# Patient Record
Sex: Female | Born: 1980 | Race: Black or African American | Hispanic: No | Marital: Single | Smoking: Current some day smoker
Health system: Southern US, Community
[De-identification: ages and names within clinical notes are randomized; demographics above are authoritative.]

## PROBLEM LIST (undated history)

## (undated) DIAGNOSIS — D500 Iron deficiency anemia secondary to blood loss (chronic): Secondary | ICD-10-CM

## (undated) DIAGNOSIS — N920 Excessive and frequent menstruation with regular cycle: Secondary | ICD-10-CM

## (undated) HISTORY — PX: SKIN BIOPSY: SHX1

---

## 2013-01-16 ENCOUNTER — Emergency Department (HOSPITAL_COMMUNITY)
Admission: EM | Admit: 2013-01-16 | Discharge: 2013-01-16 | Disposition: A | Discharge status: Home / Self Care | Payer: Medicaid Other | Attending: Emergency Medicine | Admitting: Emergency Medicine

## 2013-01-16 ENCOUNTER — Encounter (HOSPITAL_COMMUNITY): Payer: Self-pay | Admitting: *Deleted

## 2013-01-16 DIAGNOSIS — Z7251 High risk heterosexual behavior: Secondary | ICD-10-CM | POA: Insufficient documentation

## 2013-01-16 DIAGNOSIS — Z87891 Personal history of nicotine dependence: Secondary | ICD-10-CM | POA: Insufficient documentation

## 2013-01-16 DIAGNOSIS — N72 Inflammatory disease of cervix uteri: Secondary | ICD-10-CM | POA: Insufficient documentation

## 2013-01-16 DIAGNOSIS — Z3202 Encounter for pregnancy test, result negative: Secondary | ICD-10-CM | POA: Insufficient documentation

## 2013-01-16 DIAGNOSIS — R350 Frequency of micturition: Secondary | ICD-10-CM | POA: Insufficient documentation

## 2013-01-16 DIAGNOSIS — M545 Low back pain, unspecified: Secondary | ICD-10-CM | POA: Insufficient documentation

## 2013-01-16 DIAGNOSIS — N898 Other specified noninflammatory disorders of vagina: Secondary | ICD-10-CM | POA: Insufficient documentation

## 2013-01-16 DIAGNOSIS — N949 Unspecified condition associated with female genital organs and menstrual cycle: Secondary | ICD-10-CM | POA: Insufficient documentation

## 2013-01-16 DIAGNOSIS — N390 Urinary tract infection, site not specified: Secondary | ICD-10-CM | POA: Insufficient documentation

## 2013-01-16 LAB — BASIC METABOLIC PANEL
Calcium: 9.3 mg/dL (ref 8.4–10.5)
Creatinine, Ser: 1.04 mg/dL (ref 0.50–1.10)
GFR calc Af Amer: 82 mL/min — ABNORMAL LOW (ref >90)
GFR calc non Af Amer: 71 mL/min — ABNORMAL LOW (ref >90)
Sodium: 140 mEq/L (ref 135–145)

## 2013-01-16 LAB — CBC
MCH: 19.8 pg — ABNORMAL LOW (ref 26.0–34.0)
MCHC: 29.9 g/dL — ABNORMAL LOW (ref 30.0–36.0)
MCV: 66.4 fL — ABNORMAL LOW (ref 78.0–100.0)
Platelets: 373 10*3/uL (ref 150–400)
RDW: 18.5 % — ABNORMAL HIGH (ref 11.5–15.5)

## 2013-01-16 LAB — URINALYSIS, ROUTINE W REFLEX MICROSCOPIC
Glucose, UA: NEGATIVE mg/dL
Ketones, ur: NEGATIVE mg/dL
Nitrite: NEGATIVE
Protein, ur: NEGATIVE mg/dL
Urobilinogen, UA: 1 mg/dL (ref 0.0–1.0)

## 2013-01-16 LAB — WET PREP, GENITAL

## 2013-01-16 LAB — URINE MICROSCOPIC-ADD ON

## 2013-01-16 MED ORDER — METRONIDAZOLE 500 MG PO TABS: 500.0000 mg | ORAL_TABLET | Freq: Two times a day (BID) | ORAL | Status: DC

## 2013-01-16 MED ORDER — IBUPROFEN 800 MG PO TABS
800.0000 mg | ORAL_TABLET | Freq: Once | ORAL | Status: AC
  Administered 2013-01-16: 800 mg via ORAL
  Filled 2013-01-16: qty 1

## 2013-01-16 MED ORDER — CIPROFLOXACIN HCL 500 MG PO TABS: 500.0000 mg | ORAL_TABLET | Freq: Two times a day (BID) | ORAL | Status: DC

## 2013-01-16 MED ORDER — CEFTRIAXONE SODIUM 250 MG IJ SOLR
250.0000 mg | Freq: Once | INTRAMUSCULAR | Status: AC
  Administered 2013-01-16: 250 mg via INTRAMUSCULAR
  Filled 2013-01-16: qty 250

## 2013-01-16 MED ORDER — AZITHROMYCIN 1 G PO PACK
1.0000 g | PACK | Freq: Once | ORAL | Status: AC
  Administered 2013-01-16: 1 g via ORAL
  Filled 2013-01-16: qty 1

## 2013-01-16 NOTE — ED Provider Notes (Signed)
History     CSN: [id number]  Arrival date & time 01/16/13  1034   First MD Initiated Contact with Patient 01/16/13 1122      No chief complaint on file.   (Consider location/radiation/quality/duration/timing/severity/associated sxs/prior treatment) HPI  Patient is a 32 year old female G3 P3 presenting for a week and a half of dysuria and vaginal discharge with mild vaginal discomfort.. Patient states the discharge is greenish yellow and copious. She has some associated low back pain. Patient states she's had recent unprotected sex and her last menstrual period was one month ago patient typically has normal cycles. Patient denies any prior history of any sexually transmitted diseases. Patient has increased frequency and urgency with her dysuria but denies any hematuria. Denies fever, chills, nausea, vomiting, diarrhea, abdominal pain.  History reviewed. No pertinent past medical history.  Past Surgical History  Procedure Laterality Date  . Cesarean section      No family history on file.  History  Substance Use Topics  . Smoking status: Former Games developer  . Smokeless tobacco: Not on file  . Alcohol Use: No    OB History   Grav Para Term Preterm Abortions TAB SAB Ect Mult Living                  Review of Systems  Constitutional: Negative.   HENT: Negative.   Eyes: Negative.   Respiratory: Negative.   Cardiovascular: Negative.   Gastrointestinal: Negative.   Genitourinary: Positive for dysuria, frequency and vaginal discharge. Negative for vaginal bleeding and pelvic pain.       Vaginal discomfort  Musculoskeletal:       Mild low back discomfort   Skin: Negative.   Neurological: Negative.   Psychiatric/Behavioral: Negative.     Allergies  Review of patient's allergies indicates no known allergies.  Home Medications  No current outpatient prescriptions on file.  BP 143/95  Pulse 87  Temp(Src) 98.4 F (36.9 C) (Oral)  Resp 20  SpO2 100%  LMP  12/15/2012  Physical Exam  Constitutional: She is oriented to person, place, and time. She appears well-developed and well-nourished.  HENT:  Head: Normocephalic and atraumatic.  Eyes: EOM are normal. Pupils are equal, round, and reactive to light.  Neck: Neck supple.  Cardiovascular: Normal rate, regular rhythm and normal heart sounds.   Pulmonary/Chest: Effort normal and breath sounds normal.  Abdominal: Soft. Bowel sounds are normal. There is no tenderness.  Genitourinary: Uterus normal. Cervix exhibits discharge. Cervix exhibits no motion tenderness and no friability. Right adnexum displays no mass, no tenderness and no fullness. Left adnexum displays no mass, no tenderness and no fullness. Vaginal discharge found.  Neurological: She is alert and oriented to person, place, and time.  Skin: Skin is warm and dry.  Psychiatric: She has a normal mood and affect.    ED Course  Procedures (including critical care time)  Labs Reviewed  CBC - Abnormal; Notable for the following:    Hemoglobin 9.2 (*)    HCT 30.8 (*)    MCV 66.4 (*)    MCH 19.8 (*)    MCHC 29.9 (*)    RDW 18.5 (*)    All other components within normal limits  BASIC METABOLIC PANEL - Abnormal; Notable for the following:    GFR calc non Af Amer 71 (*)    GFR calc Af Amer 82 (*)    All other components within normal limits  URINALYSIS, ROUTINE W REFLEX MICROSCOPIC - Abnormal; Notable for the following:  APPearance HAZY (*)    Hgb urine dipstick SMALL (*)    Leukocytes, UA LARGE (*)    All other components within normal limits  URINE MICROSCOPIC-ADD ON - Abnormal; Notable for the following:    Bacteria, UA FEW (*)    All other components within normal limits  URINE CULTURE  PREGNANCY, URINE   No results found.   1. Cervicitis   2. UTI (urinary tract infection)       MDM  Patient to be discharged with instructions to follow up with OBGYN. Pt understands GC/Chlamydia cultures pending and that they will  need to inform all sexual partners within the last 6 months if results return positive. Pt has been treated prophylacticly with azithromycin and rocephin due to pts history, pelvic exam, and wet prep with increased WBCs. Pt advised that she will receive a call in 48 hours if the test is positive and to refrain from sexual activity for 48 hours. If the test is positive, pt is advised to refrain from sexual activity for 10 days for the medicine to take effect.  Pt not concerning for PID because hemodynamically stable and no cervical motion tenderness on pelvic exam. Pt has also been treated with flagyl for Bacterial Vaginosis. Pt has been advised to not drink alcohol while on this medication. Pt has been diagnosed with a UTI. Pt is afebrile, no CVA tenderness, normotensive, and denies N/V. Pt to be dc home with antibiotics and instructions to follow up with PCP if symptoms persist. Discussed that because pt has had recent unprotected sex, might want to consider getting tested for HIV as well. Counseled pt that latex condoms are the only way to prevent against STDs or HIV. Patient d/w with Dr. Freida Busman, agrees with plan.               Jeannetta Ellis, PA-C 01/16/13 1556

## 2013-01-16 NOTE — ED Notes (Signed)
Pt is here with lower abdominal pain, lower abdominal pain, and vaginal discharge (yellow/green) with odor.  Duration 1.5 weeks.  LMP-12/15/12

## 2013-01-16 NOTE — ED Notes (Signed)
Pt discharged to home with family. NAD.  

## 2013-01-16 NOTE — ED Notes (Signed)
Pt was triaged, placed in peds with son to be triaged and explained she would have to be seen on adult side.  Peds collected urine and lab was sent to peds to collect blood

## 2013-01-17 ENCOUNTER — Telehealth (HOSPITAL_COMMUNITY): Payer: Self-pay | Admitting: Emergency Medicine

## 2013-01-17 LAB — URINE CULTURE: Colony Count: NO GROWTH

## 2013-01-17 NOTE — ED Notes (Signed)
Pt called for lab results.  ID Verified x 2.  Pt informed STD results negative.

## 2013-01-18 NOTE — ED Provider Notes (Signed)
Medical screening examination/treatment/procedure(s) were performed by non-physician practitioner and as supervising physician I was immediately available for consultation/collaboration.  Toy Baker, MD 01/18/13 [phone]

## 2013-01-30 ENCOUNTER — Telehealth (HOSPITAL_COMMUNITY): Payer: Self-pay | Admitting: Emergency Medicine

## 2013-01-30 NOTE — ED Notes (Signed)
Pt called for std results which were given to pt.

## 2013-01-30 NOTE — ED Notes (Signed)
Patient called to reconfirm dx of STD

## 2013-04-19 ENCOUNTER — Emergency Department (HOSPITAL_COMMUNITY)
Admission: EM | Admit: 2013-04-19 | Discharge: 2013-04-19 | Disposition: A | Discharge status: Home / Self Care | Payer: Medicaid Other | Attending: Emergency Medicine | Admitting: Emergency Medicine

## 2013-04-19 ENCOUNTER — Encounter (HOSPITAL_COMMUNITY): Payer: Self-pay | Admitting: Family Medicine

## 2013-04-19 DIAGNOSIS — Y929 Unspecified place or not applicable: Secondary | ICD-10-CM | POA: Insufficient documentation

## 2013-04-19 DIAGNOSIS — R52 Pain, unspecified: Secondary | ICD-10-CM

## 2013-04-19 DIAGNOSIS — Z87891 Personal history of nicotine dependence: Secondary | ICD-10-CM | POA: Insufficient documentation

## 2013-04-19 DIAGNOSIS — S8990XA Unspecified injury of unspecified lower leg, initial encounter: Secondary | ICD-10-CM | POA: Insufficient documentation

## 2013-04-19 DIAGNOSIS — W010XXA Fall on same level from slipping, tripping and stumbling without subsequent striking against object, initial encounter: Secondary | ICD-10-CM | POA: Insufficient documentation

## 2013-04-19 DIAGNOSIS — H6092 Unspecified otitis externa, left ear: Secondary | ICD-10-CM

## 2013-04-19 DIAGNOSIS — Y9311 Activity, swimming: Secondary | ICD-10-CM | POA: Insufficient documentation

## 2013-04-19 DIAGNOSIS — W19XXXA Unspecified fall, initial encounter: Secondary | ICD-10-CM

## 2013-04-19 DIAGNOSIS — H60399 Other infective otitis externa, unspecified ear: Secondary | ICD-10-CM | POA: Insufficient documentation

## 2013-04-19 MED ORDER — CIPROFLOXACIN-DEXAMETHASONE 0.3-0.1 % OT SUSP: 4.0000 [drp] | Freq: Two times a day (BID) | OTIC | Status: DC

## 2013-04-19 MED ORDER — TRAMADOL HCL 50 MG PO TABS: 50.0000 mg | ORAL_TABLET | Freq: Four times a day (QID) | ORAL | Status: DC | PRN

## 2013-04-19 NOTE — ED Provider Notes (Signed)
History    CSN: [id number] Arrival date & time 04/19/13  1327  First MD Initiated Contact with Patient 04/19/13 1419     Chief Complaint  Patient presents with  . Fall   (Consider location/radiation/quality/duration/timing/severity/associated sxs/prior Treatment) The history is provided by the patient and medical records.   Pt presents to the ED complaining of diffuse right sided pain [place].  Pt states she did not see the water in the floor, stepped in it and fell onto the floor with initial impact onto her right knee and fell onto her right side.  No head trauma or LOC.  Now has diffuse pain of right shoulder, side, hip, and knee.  No difficulty walking, pt ambulatory multiple times in the ED.  No numbness or paresthesias of LE.  Pt also complains of left ear pain x 2 days.  Went swimming a few weeks ago which she usually does not do.  Pain worse with lying on her left ear and moving the ear.  No changes in hearing or tinnitus.  No fevers, sweats, or chills.  History reviewed. No pertinent past medical history. Past Surgical History  Procedure Laterality Date  . Cesarean section     History reviewed. No pertinent family history. History  Substance Use Topics  . Smoking status: Former Games developer  . Smokeless tobacco: Not on file  . Alcohol Use: No   OB History   Grav Para Term Preterm Abortions TAB SAB Ect Mult Living                 Review of Systems  HENT: Positive for ear pain.   Musculoskeletal: Positive for arthralgias.  All other systems reviewed and are negative.    Allergies  Review of patient's allergies indicates no known allergies.  Home Medications   Current Outpatient Rx  Name  Route  Sig  Dispense  Refill  . ibuprofen (ADVIL,MOTRIN) 200 MG tablet   Oral   Take 400 mg by mouth every 6 (six) hours as needed for pain.          BP 116/66  Pulse 74  Temp(Src) 98.3 F (36.8 C)  Resp 18  SpO2 100%  LMP  03/27/2013  Physical Exam  Nursing note and vitals reviewed. Constitutional: She is oriented to person, place, and time. She appears well-developed and well-nourished.  HENT:  Head: Normocephalic and atraumatic.  Right Ear: Tympanic membrane and ear canal normal.  Left Ear: Tympanic membrane normal. There is swelling and tenderness.  Mouth/Throat: Oropharynx is clear and moist.  Left EAC swollen and erythematous, left TM normal in appearance, pain with palpation to tragus and movement of external ear  Eyes: Conjunctivae and EOM are normal. Pupils are equal, round, and reactive to light.  Neck: Normal range of motion.  Cardiovascular: Normal rate, regular rhythm and normal heart sounds.   Pulmonary/Chest: Effort normal and breath sounds normal. She exhibits no tenderness, no bony tenderness, no laceration, no edema and no retraction.  No abrasion, laceration, bruising, deformity, or crepitus  Abdominal: Soft. Bowel sounds are normal.  Musculoskeletal: Normal range of motion.       Right shoulder: She exhibits pain. She exhibits normal range of motion, no tenderness, no bony tenderness, no swelling, no effusion, no crepitus, no deformity, no laceration, no spasm, normal pulse and normal strength.       Right knee: She exhibits normal range of motion, no swelling, no effusion, no ecchymosis, no deformity, no  laceration and no erythema. No tenderness found.  Full ROM of right knee and shoulder with some crepitus noted, no pinpoint TTP, strong distal pulses, sensation intact  Neurological: She is alert and oriented to person, place, and time.  Skin: Skin is warm and dry.  Psychiatric: She has a normal mood and affect.    ED Course  Procedures (including critical care time) Labs Reviewed - No data to display No results found.  1. Fall, initial encounter   2. Diffuse pain     MDM   Left OE without OM.  Diffuse pain of right side without pinpoint tenderness.  Pt moving all extremities,  ambulatory multiple times in the ED without difficulty-- imaging deferred.  Pt written for tramadol and ciprodex as discussed with pt but she left without Rxs or d/c instructions.  Garlon Hatchet, PA-C 04/19/13 1559

## 2013-04-19 NOTE — ED Notes (Signed)
PT DID NOT RETURN TO RECEIVE D/C INSTRUCTIONS OR RX.

## 2013-04-19 NOTE — ED Notes (Addendum)
Per pt she slipped and fell in some water yesterday and now having left side pain. sts left side of neck, arm, knee and leg

## 2013-04-19 NOTE — ED Notes (Signed)
Left before receiving discharge instructions or Rx.

## 2013-04-19 NOTE — ED Notes (Signed)
C/O right neck, arm, knee and leg pain since fall yesterday. Also c/o left ear ache x 2 days.

## 2013-04-19 NOTE — ED Notes (Signed)
Pt not found in room or in lobby to give discharge instructions and Rx.

## 2013-04-22 NOTE — ED Provider Notes (Signed)
Medical screening examination/treatment/procedure(s) were performed by non-physician practitioner and as supervising physician I was immediately available for consultation/collaboration.  Derwood Kaplan, MD 04/22/13 [phone]

## 2014-05-09 ENCOUNTER — Emergency Department (HOSPITAL_COMMUNITY)
Admission: EM | Admit: 2014-05-09 | Discharge: 2014-05-09 | Discharge status: Against Medical Advice | Payer: Medicaid Other | Attending: Emergency Medicine | Admitting: Emergency Medicine

## 2014-05-09 ENCOUNTER — Encounter (HOSPITAL_COMMUNITY): Payer: Self-pay | Admitting: Emergency Medicine

## 2014-05-09 DIAGNOSIS — M545 Low back pain, unspecified: Secondary | ICD-10-CM | POA: Insufficient documentation

## 2014-05-09 DIAGNOSIS — R42 Dizziness and giddiness: Secondary | ICD-10-CM | POA: Insufficient documentation

## 2014-05-09 DIAGNOSIS — R110 Nausea: Secondary | ICD-10-CM | POA: Diagnosis not present

## 2014-05-09 DIAGNOSIS — F17200 Nicotine dependence, unspecified, uncomplicated: Secondary | ICD-10-CM | POA: Diagnosis not present

## 2014-05-09 LAB — URINALYSIS, ROUTINE W REFLEX MICROSCOPIC
BILIRUBIN URINE: NEGATIVE
Glucose, UA: NEGATIVE mg/dL
KETONES UR: NEGATIVE mg/dL
Leukocytes, UA: NEGATIVE
NITRITE: NEGATIVE
PH: 5 (ref 5.0–8.0)
Protein, ur: NEGATIVE mg/dL
SPECIFIC GRAVITY, URINE: 1.018 (ref 1.005–1.030)
Urobilinogen, UA: 0.2 mg/dL (ref 0.0–1.0)

## 2014-05-09 LAB — URINE MICROSCOPIC-ADD ON

## 2014-05-09 LAB — POC URINE PREG, ED: Preg Test, Ur: NEGATIVE

## 2014-05-09 NOTE — ED Notes (Signed)
Pt reports lower back pain that radiates down R leg, also reports nausea and dizziness

## 2014-05-12 ENCOUNTER — Encounter (HOSPITAL_COMMUNITY): Payer: Self-pay | Admitting: Emergency Medicine

## 2014-05-12 ENCOUNTER — Emergency Department (HOSPITAL_COMMUNITY)
Admission: EM | Admit: 2014-05-12 | Discharge: 2014-05-13 | Disposition: A | Discharge status: Home / Self Care | Payer: Medicaid Other | Attending: Emergency Medicine | Admitting: Emergency Medicine

## 2014-05-12 DIAGNOSIS — D500 Iron deficiency anemia secondary to blood loss (chronic): Secondary | ICD-10-CM | POA: Diagnosis not present

## 2014-05-12 DIAGNOSIS — M545 Low back pain, unspecified: Secondary | ICD-10-CM | POA: Insufficient documentation

## 2014-05-12 DIAGNOSIS — Z3202 Encounter for pregnancy test, result negative: Secondary | ICD-10-CM | POA: Diagnosis not present

## 2014-05-12 DIAGNOSIS — F17200 Nicotine dependence, unspecified, uncomplicated: Secondary | ICD-10-CM | POA: Insufficient documentation

## 2014-05-12 DIAGNOSIS — M7989 Other specified soft tissue disorders: Secondary | ICD-10-CM | POA: Diagnosis present

## 2014-05-12 DIAGNOSIS — R109 Unspecified abdominal pain: Secondary | ICD-10-CM | POA: Insufficient documentation

## 2014-05-12 LAB — CBC WITH DIFFERENTIAL/PLATELET
BASOS ABS: 0 10*3/uL (ref 0.0–0.1)
Basophils Relative: 0 % (ref 0–1)
EOS ABS: 0.3 10*3/uL (ref 0.0–0.7)
Eosinophils Relative: 3 % (ref 0–5)
HEMATOCRIT: 26.1 % — AB (ref 36.0–46.0)
Hemoglobin: 7.6 g/dL — ABNORMAL LOW (ref 12.0–15.0)
LYMPHS PCT: 23 % (ref 12–46)
Lymphs Abs: 2.1 10*3/uL (ref 0.7–4.0)
MCH: 20.2 pg — ABNORMAL LOW (ref 26.0–34.0)
MCHC: 29.1 g/dL — AB (ref 30.0–36.0)
MCV: 69.2 fL — ABNORMAL LOW (ref 78.0–100.0)
MONOS PCT: 3 % (ref 3–12)
Monocytes Absolute: 0.3 10*3/uL (ref 0.1–1.0)
NEUTROS PCT: 71 % (ref 43–77)
Neutro Abs: 6.3 10*3/uL (ref 1.7–7.7)
PLATELETS: 325 10*3/uL (ref 150–400)
RBC: 3.77 MIL/uL — ABNORMAL LOW (ref 3.87–5.11)
RDW: 18.3 % — AB (ref 11.5–15.5)
WBC: 9 10*3/uL (ref 4.0–10.5)

## 2014-05-12 LAB — COMPREHENSIVE METABOLIC PANEL
ALBUMIN: 3.1 g/dL — AB (ref 3.5–5.2)
ALK PHOS: 84 U/L (ref 39–117)
ALT: 13 U/L (ref 0–35)
AST: 21 U/L (ref 0–37)
Anion gap: 11 (ref 5–15)
BUN: 15 mg/dL (ref 6–23)
CALCIUM: 8.9 mg/dL (ref 8.4–10.5)
CO2: 24 mEq/L (ref 19–32)
Chloride: 108 mEq/L (ref 96–112)
Creatinine, Ser: 1.03 mg/dL (ref 0.50–1.10)
GFR calc non Af Amer: 71 mL/min — ABNORMAL LOW (ref >90)
GFR, EST AFRICAN AMERICAN: 82 mL/min — AB (ref >90)
GLUCOSE: 96 mg/dL (ref 70–99)
POTASSIUM: 3.8 meq/L (ref 3.7–5.3)
SODIUM: 143 meq/L (ref 137–147)
TOTAL PROTEIN: 6.6 g/dL (ref 6.0–8.3)
Total Bilirubin: <0.2 mg/dL — ABNORMAL LOW (ref 0.3–1.2)

## 2014-05-12 LAB — URINALYSIS, ROUTINE W REFLEX MICROSCOPIC
Bilirubin Urine: NEGATIVE
GLUCOSE, UA: NEGATIVE mg/dL
Ketones, ur: 15 mg/dL — AB
Leukocytes, UA: NEGATIVE
NITRITE: NEGATIVE
PROTEIN: NEGATIVE mg/dL
SPECIFIC GRAVITY, URINE: 1.028 (ref 1.005–1.030)
Urobilinogen, UA: 0.2 mg/dL (ref 0.0–1.0)
pH: 5 (ref 5.0–8.0)

## 2014-05-12 LAB — URINE MICROSCOPIC-ADD ON

## 2014-05-12 LAB — POC URINE PREG, ED: Preg Test, Ur: NEGATIVE

## 2014-05-12 MED ORDER — FERROUS SULFATE 325 (65 FE) MG PO TABS: 325.0000 mg | ORAL_TABLET | Freq: Every day | ORAL | Status: DC

## 2014-05-12 MED ORDER — TRAMADOL HCL 50 MG PO TABS: 50.0000 mg | ORAL_TABLET | Freq: Four times a day (QID) | ORAL | Status: DC | PRN

## 2014-05-12 MED ORDER — OXYCODONE-ACETAMINOPHEN 5-325 MG PO TABS
1.0000 | ORAL_TABLET | Freq: Once | ORAL | Status: AC
  Administered 2014-05-12: 1 via ORAL
  Filled 2014-05-12: qty 1

## 2014-05-12 NOTE — ED Provider Notes (Signed)
CSN: [id number]     Arrival date & time 05/12/14  2032 History   First MD Initiated Contact with Patient 05/12/14 2259     Chief Complaint  Patient presents with  . Foot Swelling  . Flank Pain     (Consider location/radiation/quality/duration/timing/severity/associated sxs/prior Treatment) Patient is a 34 y.o. female presenting with flank pain.  Flank Pain    This patient is a 33 yo obese woman who states she is otherwise healthy. She comes in from home with complaints of aching pain in her right low back and a "knot" in that area. Pain is worse with certain movements of the torso. Pain is nonradiating and moderate in severity. Pt denies history of trauma or strain. No GU sx. No fever.   Patient also complains that both of her feet are swollen.  She says she has no h/o same. Says she has felt fatigued. Has been sleeping more than normal but does not wake up rested. Followed by a PCP with annual well check exams.   History reviewed. No pertinent past medical history. Past Surgical History  Procedure Laterality Date  . Cesarean section     History reviewed. No pertinent family history. History  Substance Use Topics  . Smoking status: Current Some Day Smoker  . Smokeless tobacco: Never Used  . Alcohol Use: No   OB History   Grav Para Term Preterm Abortions TAB SAB Ect Mult Living                 Review of Systems  Genitourinary: Positive for flank pain.    10 point review of symptoms obtained and is negative with the exceptions of symptoms noted abov.e   Allergies  Review of patient's allergies indicates no known allergies.  Home Medications   Prior to Admission medications   Medication Sig Start Date End Date Taking? Authorizing Provider  ibuprofen (ADVIL,MOTRIN) 200 MG tablet Take 400 mg by mouth every 6 (six) hours as needed for pain.   Yes Historical Provider, MD   BP 125/77  Pulse 90  Temp(Src) 97.6 F (36.4 C) (Oral)  Resp 20  Ht 5\' 6"  (1.676 m)  Wt 225 lb  (102.059 kg)  BMI 36.33 kg/m2  SpO2 100%  LMP 03/31/2014 Physical Exam  Gen: well nourished and well developed appearing, sleeping but arousable. Head: NCAT Ears: normal to inspection Nose: normal to inspection, no epistaxis or drainage Mouth: oral mucsoa is well hydrated appearing, normal posterior oropharynx Neck: supple, no stridor CV: RRR, no murmur, palpable peripheral pulses Resp: lung sounds are clear to auscultation bilaterally, no wheeing or rhonchi or rales, normal respiratory effort. Abd: morbidly obese,soft, nontender, nondistended Back: normal to inspection, no midline or CVA ttp. ttp over the right paraspinal musculature in lumbar region.  Extremities: normal to inspection.  Skin: warm and dry, I can't say I really appreciate any edema of the lower extremities.  Neuro: CN ii - XII, no focal deficitis Psyche; normal affect, cooperative.   ED Course  Procedures (including critical care time) Labs Review  Results for orders placed during the hospital encounter of 05/12/14 (from the past 24 hour(s))  URINALYSIS, ROUTINE W REFLEX MICROSCOPIC     Status: Abnormal   Collection Time    05/12/14  9:50 PM      Result Value Ref Range   Color, Urine YELLOW  YELLOW   APPearance CLOUDY (*) CLEAR   Specific Gravity, Urine 1.028  1.005 - 1.030   pH 5.0  5.0 - 8.0  Glucose, UA NEGATIVE  NEGATIVE mg/dL   Hgb urine dipstick SMALL (*) NEGATIVE   Bilirubin Urine NEGATIVE  NEGATIVE   Ketones, ur 15 (*) NEGATIVE mg/dL   Protein, ur NEGATIVE  NEGATIVE mg/dL   Urobilinogen, UA 0.2  0.0 - 1.0 mg/dL   Nitrite NEGATIVE  NEGATIVE   Leukocytes, UA NEGATIVE  NEGATIVE  URINE MICROSCOPIC-ADD ON     Status: Abnormal   Collection Time    05/12/14  9:50 PM      Result Value Ref Range   Squamous Epithelial / LPF FEW (*) RARE   WBC, UA 3-6  <3 WBC/hpf   RBC / HPF 3-6  <3 RBC/hpf   Bacteria, UA FEW (*) RARE   Casts HYALINE CASTS (*) NEGATIVE  CBC WITH DIFFERENTIAL     Status: Abnormal    Collection Time    05/12/14 10:00 PM      Result Value Ref Range   WBC 9.0  4.0 - 10.5 K/uL   RBC 3.77 (*) 3.87 - 5.11 MIL/uL   Hemoglobin 7.6 (*) 12.0 - 15.0 g/dL   HCT 16.126.1 (*) 09.636.0 - 04.546.0 %   MCV 69.2 (*) 78.0 - 100.0 fL   MCH 20.2 (*) 26.0 - 34.0 pg   MCHC 29.1 (*) 30.0 - 36.0 g/dL   RDW 40.918.3 (*) 81.111.5 - 91.415.5 %   Platelets 325  150 - 400 K/uL   Neutrophils Relative % 71  43 - 77 %   Lymphocytes Relative 23  12 - 46 %   Monocytes Relative 3  3 - 12 %   Eosinophils Relative 3  0 - 5 %   Basophils Relative 0  0 - 1 %   Neutro Abs 6.3  1.7 - 7.7 K/uL   Lymphs Abs 2.1  0.7 - 4.0 K/uL   Monocytes Absolute 0.3  0.1 - 1.0 K/uL   Eosinophils Absolute 0.3  0.0 - 0.7 K/uL   Basophils Absolute 0.0  0.0 - 0.1 K/uL   RBC Morphology POLYCHROMASIA PRESENT     Smear Review LARGE PLATELETS PRESENT    COMPREHENSIVE METABOLIC PANEL     Status: Abnormal   Collection Time    05/12/14 10:00 PM      Result Value Ref Range   Sodium 143  137 - 147 mEq/L   Potassium 3.8  3.7 - 5.3 mEq/L   Chloride 108  96 - 112 mEq/L   CO2 24  19 - 32 mEq/L   Glucose, Bld 96  70 - 99 mg/dL   BUN 15  6 - 23 mg/dL   Creatinine, Ser 7.821.03  0.50 - 1.10 mg/dL   Calcium 8.9  8.4 - 95.610.5 mg/dL   Total Protein 6.6  6.0 - 8.3 g/dL   Albumin 3.1 (*) 3.5 - 5.2 g/dL   AST 21  0 - 37 U/L   ALT 13  0 - 35 U/L   Alkaline Phosphatase 84  39 - 117 U/L   Total Bilirubin <0.2 (*) 0.3 - 1.2 mg/dL   GFR calc non Af Amer 71 (*) >90 mL/min   GFR calc Af Amer 82 (*) >90 mL/min   Anion gap 11  5 - 15  POC URINE PREG, ED     Status: None   Collection Time    05/12/14 10:07 PM      Result Value Ref Range   Preg Test, Ur NEGATIVE  NEGATIVE     MDM     ED work up is  notable for microcytic anemia - worsened from 2014. Patient endorses heavy periods. We will start on iron supplement. Patient counseled re: importance of close outpatient f/u with her PCP and need for thyroid screening.   Her back pain appears to be MSK and we will  manage symptomatically. The patient needs to lose weight.   Brandt Loosen, MD 05/12/14 [phone]

## 2014-05-12 NOTE — ED Notes (Signed)
Patient presents with c/o right side pain that startes in her lower back and down her legs.  Feet swelling bilaterally

## 2014-05-12 NOTE — Discharge Instructions (Signed)
Anemia, Nonspecific  Anemia is a condition in which the concentration of red blood cells or hemoglobin in the blood is below normal. Hemoglobin is a substance in red blood cells that carries oxygen to the tissues of the body. Anemia results in not enough oxygen reaching these tissues.   CAUSES   Common causes of anemia include:   · Excessive bleeding. Bleeding may be internal or external. This includes excessive bleeding from periods (in women) or from the intestine.    · Poor nutrition.    · Chronic kidney, thyroid, and liver disease.   · Bone marrow disorders that decrease red blood cell production.  · Cancer and treatments for cancer.  · HIV, AIDS, and their treatments.  · Spleen problems that increase red blood cell destruction.  · Blood disorders.  · Excess destruction of red blood cells due to infection, medicines, and autoimmune disorders.  SIGNS AND SYMPTOMS   · Minor weakness.    · Dizziness.    · Headache.  · Palpitations.    · Shortness of breath, especially with exercise.    · Paleness.  · Cold sensitivity.  · Indigestion.  · Nausea.  · Difficulty sleeping.  · Difficulty concentrating.  Symptoms may occur suddenly or they may develop slowly.   DIAGNOSIS   Additional blood tests are often needed. These help your health care provider determine the best treatment. Your health care provider will check your stool for blood and look for other causes of blood loss.   TREATMENT   Treatment varies depending on the cause of the anemia. Treatment can include:   · Supplements of iron, vitamin B12, or folic acid.    · Hormone medicines.    · A blood transfusion. This may be needed if blood loss is severe.    · Hospitalization. This may be needed if there is significant continual blood loss.    · Dietary changes.  · Spleen removal.  HOME CARE INSTRUCTIONS  Keep all follow-up appointments. It often takes many weeks to correct anemia, and having your health care provider check on your condition and your response to  treatment is very important.  SEEK IMMEDIATE MEDICAL CARE IF:   · You develop extreme weakness, shortness of breath, or chest pain.    · You become dizzy or have trouble concentrating.  · You develop heavy vaginal bleeding.    · You develop a rash.    · You have bloody or black, tarry stools.    · You faint.    · You vomit up blood.    · You vomit repeatedly.    · You have abdominal pain.  · You have a fever or persistent symptoms for more than 2-3 days.    · You have a fever and your symptoms suddenly get worse.    · You are dehydrated.    MAKE SURE YOU:  · Understand these instructions.  · Will watch your condition.  · Will get help right away if you are not doing well or get worse.  Document Released: 10/21/2004 Document Revised: 05/16/2013 Document Reviewed: 03/09/2013  Exit[employer]® Patient Information ©2015 [employer]. This information is not intended to replace advice given to you by your health care provider. Make sure you discuss any questions you have with your health care provider.  Anemia, Nonspecific  Anemia is a condition in which the concentration of red blood cells or hemoglobin in the blood is below normal. Hemoglobin is a substance in red blood cells that carries oxygen to the tissues of the body. Anemia results in not enough oxygen reaching these tissues.   CAUSES     Common causes of anemia include:   · Excessive bleeding. Bleeding may be internal or external. This includes excessive bleeding from periods (in women) or from the intestine.    · Poor nutrition.    · Chronic kidney, thyroid, and liver disease.   · Bone marrow disorders that decrease red blood cell production.  · Cancer and treatments for cancer.  · HIV, AIDS, and their treatments.  · Spleen problems that increase red blood cell destruction.  · Blood disorders.  · Excess destruction of red blood cells due to infection, medicines, and autoimmune disorders.  SIGNS AND SYMPTOMS   · Minor weakness.    · Dizziness.     · Headache.  · Palpitations.    · Shortness of breath, especially with exercise.    · Paleness.  · Cold sensitivity.  · Indigestion.  · Nausea.  · Difficulty sleeping.  · Difficulty concentrating.  Symptoms may occur suddenly or they may develop slowly.   DIAGNOSIS   Additional blood tests are often needed. These help your health care provider determine the best treatment. Your health care provider will check your stool for blood and look for other causes of blood loss.   TREATMENT   Treatment varies depending on the cause of the anemia. Treatment can include:   · Supplements of iron, vitamin B12, or folic acid.    · Hormone medicines.    · A blood transfusion. This may be needed if blood loss is severe.    · Hospitalization. This may be needed if there is significant continual blood loss.    · Dietary changes.  · Spleen removal.  HOME CARE INSTRUCTIONS  Keep all follow-up appointments. It often takes many weeks to correct anemia, and having your health care provider check on your condition and your response to treatment is very important.  SEEK IMMEDIATE MEDICAL CARE IF:   · You develop extreme weakness, shortness of breath, or chest pain.    · You become dizzy or have trouble concentrating.  · You develop heavy vaginal bleeding.    · You develop a rash.    · You have bloody or black, tarry stools.    · You faint.    · You vomit up blood.    · You vomit repeatedly.    · You have abdominal pain.  · You have a fever or persistent symptoms for more than 2-3 days.    · You have a fever and your symptoms suddenly get worse.    · You are dehydrated.    MAKE SURE YOU:  · Understand these instructions.  · Will watch your condition.  · Will get help right away if you are not doing well or get worse.  Document Released: 10/21/2004 Document Revised: 05/16/2013 Document Reviewed: 03/09/2013  ExitCare® Patient Information ©2015 [employer]. This information is not intended to replace advice given to you by your health care  provider. Make sure you discuss any questions you have with your health care provider.

## 2014-05-20 ENCOUNTER — Emergency Department (HOSPITAL_COMMUNITY)
Admission: EM | Admit: 2014-05-20 | Discharge: 2014-05-20 | Disposition: A | Discharge status: Home / Self Care | Payer: Medicaid Other | Attending: Emergency Medicine | Admitting: Emergency Medicine

## 2014-05-20 ENCOUNTER — Encounter (HOSPITAL_COMMUNITY): Payer: Self-pay | Admitting: Emergency Medicine

## 2014-05-20 DIAGNOSIS — M549 Dorsalgia, unspecified: Secondary | ICD-10-CM | POA: Diagnosis present

## 2014-05-20 DIAGNOSIS — F17200 Nicotine dependence, unspecified, uncomplicated: Secondary | ICD-10-CM | POA: Diagnosis not present

## 2014-05-20 DIAGNOSIS — R109 Unspecified abdominal pain: Secondary | ICD-10-CM | POA: Insufficient documentation

## 2014-05-20 DIAGNOSIS — IMO0002 Reserved for concepts with insufficient information to code with codable children: Secondary | ICD-10-CM | POA: Insufficient documentation

## 2014-05-20 DIAGNOSIS — M542 Cervicalgia: Secondary | ICD-10-CM | POA: Insufficient documentation

## 2014-05-20 DIAGNOSIS — Z79899 Other long term (current) drug therapy: Secondary | ICD-10-CM | POA: Insufficient documentation

## 2014-05-20 DIAGNOSIS — M5416 Radiculopathy, lumbar region: Secondary | ICD-10-CM

## 2014-05-20 MED ORDER — OXYCODONE-ACETAMINOPHEN 5-325 MG PO TABS: 2.0000 | ORAL_TABLET | Freq: Four times a day (QID) | ORAL | Status: DC | PRN

## 2014-05-20 MED ORDER — OXYCODONE-ACETAMINOPHEN 5-325 MG PO TABS
2.0000 | ORAL_TABLET | Freq: Once | ORAL | Status: AC
Start: 2014-05-20 — End: 2014-05-20
  Administered 2014-05-20: 2 via ORAL
  Filled 2014-05-20: qty 2

## 2014-05-20 MED ORDER — PREDNISONE 20 MG PO TABS: 40.0000 mg | ORAL_TABLET | Freq: Every day | ORAL | Status: DC

## 2014-05-20 NOTE — ED Notes (Signed)
Pt c/o back pain that radiates down her legs and upper her spine that has been going on for about 2 weeks now. States that when she was seen before for the pain no one told her what was causing the pain. Pt states that pain is worse after waking up in the mornings.  Pt also states that she has fevers every couple of days.

## 2014-05-20 NOTE — ED Provider Notes (Signed)
CSN: [id number]     Arrival date & time 05/20/14  1558 History  This chart was scribed for a non-physician practitioner, Roxy Horseman, PA-C, working with Mirian Mo, MD by Julian Hy, ED Scribe. The patient was seen in [facility]. The patient's care was started at 4:30 PM.    Chief Complaint  Patient presents with  . Back Pain   The history is provided by the patient. No language interpreter was used.   HPI Comments: Peggy Reid is a 33 y.o. female who presents to the Emergency Department complaining of new, constant, moderate lower back pain onset 3 weeks ago. Pt reports her pain shoots down her legs, abdomen, buttocks and her neck. Pt is able to ambulate. Pt reports she is stiff. Pt reports she previously went to [facility] ED and back pain my have been related to muscle issue. Pt states her pain is worsened with movement. Pt denies injury. Pt denies previous surgery. Pt denies hx of IV drug usage.  Denies bowel or bladder incontinence.  History reviewed. No pertinent past medical history. Past Surgical History  Procedure Laterality Date  . Cesarean section     No family history on file. History  Substance Use Topics  . Smoking status: Current Some Day Smoker  . Smokeless tobacco: Never Used  . Alcohol Use: No   OB History   Grav Para Term Preterm Abortions TAB SAB Ect Mult Living                 Review of Systems  Constitutional: Negative for fever.  Respiratory: Negative for shortness of breath.   Cardiovascular: Negative for chest pain and leg swelling.  Gastrointestinal: Positive for abdominal pain. Negative for constipation and abdominal distention.  Genitourinary: Negative for dysuria, urgency, frequency, flank pain and difficulty urinating.  Musculoskeletal: Positive for arthralgias (buttocks and legs bilaterally), back pain and neck pain. Negative for gait problem and joint swelling.  Skin: Negative for rash.  Neurological: Negative for weakness and numbness.    Allergies  Review of patient's allergies indicates no known allergies.  Home Medications   Prior to Admission medications   Medication Sig Start Date End Date Taking? Authorizing Provider  ibuprofen (ADVIL,MOTRIN) 200 MG tablet Take 400 mg by mouth every 6 (six) hours as needed for pain.   Yes Historical Provider, MD  ferrous sulfate 325 (65 FE) MG tablet Take 1 tablet (325 mg total) by mouth daily. 05/12/14   Brandt Loosen, MD  traMADol (ULTRAM) 50 MG tablet Take 1 tablet (50 mg total) by mouth every 6 (six) hours as needed. 05/12/14   Brandt Loosen, MD   Triage Vitals: BP 124/87  Pulse 70  Temp(Src) 98.4 F (36.9 C) (Oral)  Resp 20  Wt 225 lb (102.059 kg)  SpO2 100%  LMP 03/31/2014 Physical Exam  Nursing note and vitals reviewed. Constitutional: She is oriented to person, place, and time. She appears well-developed and well-nourished. No distress.  HENT:  Head: Normocephalic and atraumatic.  Eyes: Conjunctivae and EOM are normal. Right eye exhibits no discharge. Left eye exhibits no discharge. No scleral icterus.  Neck: Normal range of motion. Neck supple. No tracheal deviation present.  Cardiovascular: Normal rate, regular rhythm and normal heart sounds.  Exam reveals no gallop and no friction rub.   No murmur heard. Pulmonary/Chest: Effort normal and breath sounds normal. No respiratory distress. She has no wheezes.  Abdominal: Soft. She exhibits no distension. There is no tenderness.  Musculoskeletal: Normal range of motion.  Lumbar  paraspinal muscles tender to palpation, no bony tenderness, step-offs, or gross abnormality or deformity of spine, patient is able to ambulate, moves all extremities  Bilateral great toe extension intact Bilateral plantar/dorsiflexion intact  Neurological: She is alert and oriented to person, place, and time. She has normal reflexes.  Sensation and strength intact bilaterally Symmetrical reflexes  Skin: Skin is warm and dry. She is not  diaphoretic.  Psychiatric: She has a normal mood and affect. Her behavior is normal. Judgment and thought content normal.    ED Course  Procedures (including critical care time) DIAGNOSTIC STUDIES: Oxygen Saturation is 100% on RA, normal by my interpretation.    COORDINATION OF CARE: 4:35 PM- Patient informed of current plan for treatment and evaluation and agrees with plan at this time.  MDM   Final diagnoses:  Lumbar radiculopathy    Patient with back pain.  No neurological deficits and normal neuro exam.  Patient is ambulatory.  No loss of bowel or bladder control.  Doubt cauda equina.  Denies fever,  doubt epidural abscess or other lesion. Recommend back exercises, stretching, RICE, and will treat with a short course of percocet and prednisone.  Encouraged the patient that there could be a need for additional workup and/or imaging such as MRI, if the symptoms do not resolve. Patient advised that if the back pain does not resolve, or radiates, this could progress to more serious conditions and is encouraged to follow-up with PCP or orthopedics within 2 weeks.     I personally performed the services described in this documentation, which was scribed in my presence. The recorded information has been reviewed and is accurate.     Roxy Horseman, PA-C 05/20/14 [phone]

## 2014-05-20 NOTE — Discharge Instructions (Signed)
Back Pain, Adult °Low back pain is very common. About 1 in 5 people have back pain. The cause of low back pain is rarely dangerous. The pain often gets better over time. About half of people with a sudden onset of back pain feel better in just 2 weeks. About 8 in 10 people feel better by 6 weeks.  °CAUSES °Some common causes of back pain include: °· Strain of the muscles or ligaments supporting the spine. °· Wear and tear (degeneration) of the spinal discs. °· Arthritis. °· Direct injury to the back. °DIAGNOSIS °Most of the time, the direct cause of low back pain is not known. However, back pain can be treated effectively even when the exact cause of the pain is unknown. Answering your caregiver's questions about your overall health and symptoms is one of the most accurate ways to make sure the cause of your pain is not dangerous. If your caregiver needs more information, he or she may order lab work or imaging tests (X-rays or MRIs). However, even if imaging tests show changes in your back, this usually does not require surgery. °HOME CARE INSTRUCTIONS °For many people, back pain returns. Since low back pain is rarely dangerous, it is often a condition that people can learn to manage on their own.  °· Remain active. It is stressful on the back to sit or stand in one place. Do not sit, drive, or stand in one place for more than 30 minutes at a time. Take short walks on level surfaces as soon as pain allows. Try to increase the length of time you walk each day. °· Do not stay in bed. Resting more than 1 or 2 days can delay your recovery. °· Do not avoid exercise or work. Your body is made to move. It is not dangerous to be active, even though your back may hurt. Your back will likely heal faster if you return to being active before your pain is gone. °· Pay attention to your body when you  bend and lift. Many people have less discomfort when lifting if they bend their knees, keep the load close to their bodies, and  avoid twisting. Often, the most comfortable positions are those that put less stress on your recovering back. °· Find a comfortable position to sleep. Use a firm mattress and lie on your side with your knees slightly bent. If you lie on your back, put a pillow under your knees. °· Only take over-the-counter or prescription medicines as directed by your caregiver. Over-the-counter medicines to reduce pain and inflammation are often the most helpful. Your caregiver may prescribe muscle relaxant drugs. These medicines help dull your pain so you can more quickly return to your normal activities and healthy exercise. °· Put ice on the injured area. °¨ Put ice in a plastic bag. °¨ Place a towel between your skin and the bag. °¨ Leave the ice on for 15-20 minutes, 03-04 times a day for the first 2 to 3 days. After that, ice and heat may be alternated to reduce pain and spasms. °· Ask your caregiver about trying back exercises and gentle massage. This may be of some benefit. °· Avoid feeling anxious or stressed. Stress increases muscle tension and can worsen back pain. It is important to recognize when you are anxious or stressed and learn ways to manage it. Exercise is a great option. °SEEK MEDICAL CARE IF: °· You have pain that is not relieved with rest or medicine. °· You have pain that does not improve in 1 week. °· You have new symptoms. °· You are generally not feeling well. °SEEK   IMMEDIATE MEDICAL CARE IF:  °· You have pain that radiates from your back into your legs. °· You develop new bowel or bladder control problems. °· You have unusual weakness or numbness in your arms or legs. °· You develop nausea or vomiting. °· You develop abdominal pain. °· You feel faint. °Document Released: 09/13/2005 Document Revised: 03/14/2012 Document Reviewed: 01/15/2014 °ExitCare® Patient Information ©2015 [employer]. This information is not intended to replace advice given to you by your health care provider. Make sure you  discuss any questions you have with your health care provider. °Sciatica °Sciatica is pain, weakness, numbness, or tingling along the path of the sciatic nerve. The nerve starts in the lower back and runs down the back of each leg. The nerve controls the muscles in the lower leg and in the back of the knee, while also providing sensation to the back of the thigh, lower leg, and the sole of your foot. Sciatica is a symptom of another medical condition. For instance, nerve damage or certain conditions, such as a herniated disk or bone spur on the spine, pinch or put pressure on the sciatic nerve. This causes the pain, weakness, or other sensations normally associated with sciatica. Generally, sciatica only affects one side of the body. °CAUSES  °· Herniated or slipped disc. °· Degenerative disk disease. °· A pain disorder involving the narrow muscle in the buttocks (piriformis syndrome). °· Pelvic injury or fracture. °· Pregnancy. °· Tumor (rare). °SYMPTOMS  °Symptoms can vary from mild to very severe. The symptoms usually travel from the low back to the buttocks and down the back of the leg. Symptoms can include: °· Mild tingling or dull aches in the lower back, leg, or hip. °· Numbness in the back of the calf or sole of the foot. °· Burning sensations in the lower back, leg, or hip. °· Sharp pains in the lower back, leg, or hip. °· Leg weakness. °· Severe back pain inhibiting movement. °These symptoms may get worse with coughing, sneezing, laughing, or prolonged sitting or standing. Also, being overweight may worsen symptoms. °DIAGNOSIS  °Your caregiver will perform a physical exam to look for common symptoms of sciatica. He or she may ask you to do certain movements or activities that would trigger sciatic nerve pain. Other tests may be performed to find the cause of the sciatica. These may include: °· Blood tests. °· X-rays. °· Imaging tests, such as an MRI or CT scan. °TREATMENT  °Treatment is directed at the  cause of the sciatic pain. Sometimes, treatment is not necessary and the pain and discomfort goes away on its own. If treatment is needed, your caregiver may suggest: °· Over-the-counter medicines to relieve pain. °· Prescription medicines, such as anti-inflammatory medicine, muscle relaxants, or narcotics. °· Applying heat or ice to the painful area. °· Steroid injections to lessen pain, irritation, and inflammation around the nerve. °· Reducing activity during periods of pain. °· Exercising and stretching to strengthen your abdomen and improve flexibility of your spine. Your caregiver may suggest losing weight if the extra weight makes the back pain worse. °· Physical therapy. °· Surgery to eliminate what is pressing or pinching the nerve, such as a bone spur or part of a herniated disk. °HOME CARE INSTRUCTIONS  °· Only take over-the-counter or prescription medicines for pain or discomfort as directed by your caregiver. °· Apply ice to the affected area for 20 minutes, 3-4 times a day for the first 48-72 hours. Then try heat in the   same way. °· Exercise, stretch, or perform your usual activities if these do not aggravate your pain. °· Attend physical therapy sessions as directed by your caregiver. °· Keep all follow-up appointments as directed by your caregiver. °· Do not wear high heels or shoes that do not provide proper support. °· Check your mattress to see if it is too soft. A firm mattress may lessen your pain and discomfort. °SEEK IMMEDIATE MEDICAL CARE IF:  °· You lose control of your bowel or bladder (incontinence). °· You have increasing weakness in the lower back, pelvis, buttocks, or legs. °· You have redness or swelling of your back. °· You have a burning sensation when you urinate. °· You have pain that gets worse when you lie down or awakens you at night. °· Your pain is worse than you have experienced in the past. °· Your pain is lasting longer than 4 weeks. °· You are suddenly losing weight  without reason. °MAKE SURE YOU: °· Understand these instructions. °· Will watch your condition. °· Will get help right away if you are not doing well or get worse. °Document Released: 09/07/2001 Document Revised: 03/14/2012 Document Reviewed: 01/23/2012 °ExitCare® Patient Information ©2015 [employer]. This information is not intended to replace advice given to you by your health care provider. Make sure you discuss any questions you have with your health care provider. ° °

## 2014-05-21 NOTE — ED Provider Notes (Signed)
Medical screening examination/treatment/procedure(s) were performed by non-physician practitioner and as supervising physician I was immediately available for consultation/collaboration.   EKG Interpretation None        Matthew Gentry, MD 05/21/14 0120 

## 2014-07-31 ENCOUNTER — Emergency Department (HOSPITAL_COMMUNITY)
Admission: EM | Admit: 2014-07-31 | Discharge: 2014-07-31 | Disposition: A | Discharge status: Home / Self Care | Payer: Medicaid Other | Attending: Emergency Medicine | Admitting: Emergency Medicine

## 2014-07-31 ENCOUNTER — Encounter (HOSPITAL_COMMUNITY): Payer: Self-pay | Admitting: Emergency Medicine

## 2014-07-31 DIAGNOSIS — Z79899 Other long term (current) drug therapy: Secondary | ICD-10-CM | POA: Insufficient documentation

## 2014-07-31 DIAGNOSIS — Z3202 Encounter for pregnancy test, result negative: Secondary | ICD-10-CM | POA: Insufficient documentation

## 2014-07-31 DIAGNOSIS — Z7952 Long term (current) use of systemic steroids: Secondary | ICD-10-CM | POA: Insufficient documentation

## 2014-07-31 DIAGNOSIS — B9689 Other specified bacterial agents as the cause of diseases classified elsewhere: Secondary | ICD-10-CM

## 2014-07-31 DIAGNOSIS — N760 Acute vaginitis: Secondary | ICD-10-CM | POA: Insufficient documentation

## 2014-07-31 DIAGNOSIS — Z720 Tobacco use: Secondary | ICD-10-CM | POA: Insufficient documentation

## 2014-07-31 LAB — URINALYSIS, ROUTINE W REFLEX MICROSCOPIC
Bilirubin Urine: NEGATIVE
GLUCOSE, UA: NEGATIVE mg/dL
HGB URINE DIPSTICK: NEGATIVE
Ketones, ur: NEGATIVE mg/dL
Nitrite: NEGATIVE
PH: 6 (ref 5.0–8.0)
Protein, ur: NEGATIVE mg/dL
SPECIFIC GRAVITY, URINE: 1.024 (ref 1.005–1.030)
Urobilinogen, UA: 0.2 mg/dL (ref 0.0–1.0)

## 2014-07-31 LAB — URINE MICROSCOPIC-ADD ON

## 2014-07-31 LAB — WET PREP, GENITAL
Trich, Wet Prep: NONE SEEN
YEAST WET PREP: NONE SEEN

## 2014-07-31 LAB — POC URINE PREG, ED
PREG TEST UR: NEGATIVE
Preg Test, Ur: NEGATIVE

## 2014-07-31 MED ORDER — TRAMADOL HCL 50 MG PO TABS
50.0000 mg | ORAL_TABLET | Freq: Once | ORAL | Status: AC
  Administered 2014-07-31: 50 mg via ORAL
  Filled 2014-07-31: qty 1

## 2014-07-31 MED ORDER — TRAMADOL HCL 50 MG PO TABS: 50.0000 mg | ORAL_TABLET | Freq: Four times a day (QID) | ORAL | Status: DC | PRN

## 2014-07-31 MED ORDER — METRONIDAZOLE 500 MG PO TABS: 500.0000 mg | ORAL_TABLET | Freq: Two times a day (BID) | ORAL | Status: DC

## 2014-07-31 NOTE — Discharge Instructions (Signed)
Take Flagyl as directed until gone. Take Tramadol as needed for pain. Refer to attached documents for more information. You will be contacted if your Gonorrhea or Chlamydia test is positive.

## 2014-07-31 NOTE — ED Notes (Signed)
Pt presents with c/o malodorous vaginal discharge x 1 month. Pt states she did use Monostat 1 day tx without relief. Pt states 1 week ago she began having severe pain with urination and severe vaginal pain. Pt unable to sit straight in triage chair.

## 2014-07-31 NOTE — ED Provider Notes (Signed)
CSN: [id number]     Arrival date & time 07/31/14  1856 History   First MD Initiated Contact with Patient 07/31/14 2108     Chief Complaint  Patient presents with  . Vaginal Discharge     (Consider location/radiation/quality/duration/timing/severity/associated sxs/prior Treatment) Patient is a 33 y.o. female presenting with vaginal discharge. The history is provided by the patient. No language interpreter was used.  Vaginal Discharge Quality:  Malodorous and white Severity:  Moderate Onset quality:  Gradual Duration:  2 weeks Timing:  Constant Progression:  Worsening Chronicity:  New Context: not after intercourse, not after urination, not at rest, not during bowel movement, not during pregnancy, not during urination, not genital trauma and not recent antibiotic use   Relieved by:  Nothing Worsened by:  Nothing tried Ineffective treatments:  None tried Associated symptoms: no abdominal pain, no dysuria, no fever, no nausea and no vomiting   Risk factors: new sexual partner   Risk factors: no endometriosis, no foreign body, no gynecological surgery, no prior miscarriage and no terminated pregnancy     History reviewed. No pertinent past medical history. Past Surgical History  Procedure Laterality Date  . Cesarean section    . Skin biopsy     No family history on file. History  Substance Use Topics  . Smoking status: Current Some Day Smoker  . Smokeless tobacco: Never Used  . Alcohol Use: No   OB History    No data available     Review of Systems  Constitutional: Negative for fever, chills and fatigue.  HENT: Negative for trouble swallowing.   Eyes: Negative for visual disturbance.  Respiratory: Negative for shortness of breath.   Cardiovascular: Negative for chest pain and palpitations.  Gastrointestinal: Negative for nausea, vomiting, abdominal pain and diarrhea.  Genitourinary: Positive for vaginal discharge and pelvic pain. Negative for dysuria and difficulty  urinating.  Musculoskeletal: Negative for arthralgias and neck pain.  Skin: Negative for color change.  Neurological: Negative for dizziness and weakness.  Psychiatric/Behavioral: Negative for dysphoric mood.      Allergies  Review of patient's allergies indicates no known allergies.  Home Medications   Prior to Admission medications   Medication Sig Start Date End Date Taking? Authorizing Provider  ferrous sulfate 325 (65 FE) MG tablet Take 1 tablet (325 mg total) by mouth daily. 05/12/14  Yes Brandt LoosenJulie Manly, MD  ibuprofen (ADVIL,MOTRIN) 200 MG tablet Take 400 mg by mouth every 6 (six) hours as needed for moderate pain (pain).    Yes Historical Provider, MD  oxyCODONE-acetaminophen (PERCOCET/ROXICET) 5-325 MG per tablet Take 2 tablets by mouth every 6 (six) hours as needed for severe pain. 05/20/14   Roxy Horsemanobert Browning, PA-C  predniSONE (DELTASONE) 20 MG tablet Take 2 tablets (40 mg total) by mouth daily. 05/20/14   Roxy Horsemanobert Browning, PA-C  traMADol (ULTRAM) 50 MG tablet Take 1 tablet (50 mg total) by mouth every 6 (six) hours as needed. 05/12/14   Brandt LoosenJulie Manly, MD   BP 111/60 mmHg  Pulse 84  Temp(Src) 98.5 F (36.9 C)  Resp 17  Ht 5\' 6"  (1.676 m)  Wt 225 lb (102.059 kg)  BMI 36.33 kg/m2  SpO2 100%  LMP 07/14/2014 Physical Exam  Constitutional: She is oriented to person, place, and time. She appears well-developed and well-nourished. No distress.  HENT:  Head: Normocephalic and atraumatic.  Eyes: Conjunctivae are normal.  Neck: Normal range of motion.  Cardiovascular: Normal rate and regular rhythm.  Exam reveals no gallop and no friction rub.  No murmur heard. Pulmonary/Chest: Effort normal and breath sounds normal. She has no wheezes. She has no rales. She exhibits no tenderness.  Abdominal: Soft. She exhibits no distension. There is no tenderness. There is no rebound.  Genitourinary: Vagina normal.  Copious, frothy, white vaginal discharge. No CMT. Cervical os closed. Generalized  tenderness to palpation on bimanual exam.   Musculoskeletal: Normal range of motion.  Neurological: She is alert and oriented to person, place, and time. Coordination normal.  Speech is goal-oriented. Moves limbs without ataxia.   Skin: Skin is warm and dry.  Psychiatric: She has a normal mood and affect. Her behavior is normal.  Nursing note and vitals reviewed.   ED Course  Procedures (including critical care time) Labs Review Labs Reviewed  WET PREP, GENITAL - Abnormal; Notable for the following:    Clue Cells Wet Prep HPF POC FEW (*)    WBC, Wet Prep HPF POC MODERATE (*)    All other components within normal limits  URINALYSIS, ROUTINE W REFLEX MICROSCOPIC - Abnormal; Notable for the following:    Leukocytes, UA SMALL (*)    All other components within normal limits  URINE MICROSCOPIC-ADD ON - Abnormal; Notable for the following:    Squamous Epithelial / LPF MANY (*)    Bacteria, UA MANY (*)    All other components within normal limits  GC/CHLAMYDIA PROBE AMP  POC URINE PREG, ED  POC URINE PREG, ED    Imaging Review No results found.   EKG Interpretation None      MDM   Final diagnoses:  BV (bacterial vaginosis)    10:13 PM Wet prep and GC/Chlamydia sent to the lab. Urinalysis unremarkable for acute changes. Vitals stable and patient afebrile. Patient will have tramadol for pain.    Patient will be treated for BV with Flagyl. Patient will have tramadol for pain. Patient will be contacted in 48 hours if results are positive for GC/chlamydia.   Emilia BeckKaitlyn Threasa Kinch, PA-C 08/01/14 0034  Elwin MochaBlair Walden, MD 08/03/14 2326

## 2014-08-01 LAB — GC/CHLAMYDIA PROBE AMP
CT Probe RNA: NEGATIVE
GC Probe RNA: NEGATIVE

## 2014-09-23 ENCOUNTER — Encounter (HOSPITAL_COMMUNITY): Payer: Self-pay | Admitting: *Deleted

## 2014-09-23 ENCOUNTER — Emergency Department (HOSPITAL_COMMUNITY)
Admission: EM | Admit: 2014-09-23 | Discharge: 2014-09-23 | Discharge status: Against Medical Advice | Payer: Medicaid Other | Attending: Emergency Medicine | Admitting: Emergency Medicine

## 2014-09-23 DIAGNOSIS — Z720 Tobacco use: Secondary | ICD-10-CM | POA: Insufficient documentation

## 2014-09-23 DIAGNOSIS — R102 Pelvic and perineal pain: Secondary | ICD-10-CM | POA: Insufficient documentation

## 2014-09-23 LAB — URINALYSIS, ROUTINE W REFLEX MICROSCOPIC
Bilirubin Urine: NEGATIVE
Glucose, UA: NEGATIVE mg/dL
Hgb urine dipstick: NEGATIVE
Ketones, ur: NEGATIVE mg/dL
NITRITE: NEGATIVE
Protein, ur: NEGATIVE mg/dL
SPECIFIC GRAVITY, URINE: 1.026 (ref 1.005–1.030)
Urobilinogen, UA: 0.2 mg/dL (ref 0.0–1.0)
pH: 5 (ref 5.0–8.0)

## 2014-09-23 LAB — URINE MICROSCOPIC-ADD ON

## 2014-09-23 NOTE — ED Notes (Signed)
Pt reports she was seen 1 month ago dx with BV, pt did not get medication filled, tried to last week and pharmacy would not fill med. Pt reports continued dysuria, vaginal pain, vaginal discharge, pain 5/10 at present.

## 2014-09-23 NOTE — ED Notes (Signed)
Pt called to take to room, with no response. rn checked in bathroom and outside.

## 2014-12-18 ENCOUNTER — Emergency Department (HOSPITAL_COMMUNITY)
Admission: EM | Admit: 2014-12-18 | Discharge: 2014-12-18 | Disposition: A | Discharge status: Home / Self Care | Payer: Self-pay | Attending: Emergency Medicine | Admitting: Emergency Medicine

## 2014-12-18 ENCOUNTER — Emergency Department (HOSPITAL_COMMUNITY): Payer: Self-pay

## 2014-12-18 ENCOUNTER — Encounter (HOSPITAL_COMMUNITY): Payer: Self-pay | Admitting: Emergency Medicine

## 2014-12-18 ENCOUNTER — Emergency Department (HOSPITAL_COMMUNITY): Payer: Medicaid Other

## 2014-12-18 DIAGNOSIS — S0990XA Unspecified injury of head, initial encounter: Secondary | ICD-10-CM | POA: Insufficient documentation

## 2014-12-18 DIAGNOSIS — Y998 Other external cause status: Secondary | ICD-10-CM | POA: Insufficient documentation

## 2014-12-18 DIAGNOSIS — Y92410 Unspecified street and highway as the place of occurrence of the external cause: Secondary | ICD-10-CM | POA: Insufficient documentation

## 2014-12-18 DIAGNOSIS — S8001XA Contusion of right knee, initial encounter: Secondary | ICD-10-CM | POA: Insufficient documentation

## 2014-12-18 DIAGNOSIS — IMO0002 Reserved for concepts with insufficient information to code with codable children: Secondary | ICD-10-CM

## 2014-12-18 DIAGNOSIS — Z720 Tobacco use: Secondary | ICD-10-CM | POA: Insufficient documentation

## 2014-12-18 DIAGNOSIS — Y9389 Activity, other specified: Secondary | ICD-10-CM | POA: Insufficient documentation

## 2014-12-18 MED ORDER — DIPHENHYDRAMINE HCL 50 MG/ML IJ SOLN
25.0000 mg | Freq: Once | INTRAMUSCULAR | Status: AC
Start: 2014-12-18 — End: 2014-12-18
  Administered 2014-12-18: 25 mg via INTRAVENOUS
  Filled 2014-12-18: qty 1

## 2014-12-18 MED ORDER — SODIUM CHLORIDE 0.9 % IV BOLUS (SEPSIS)
1000.0000 mL | INTRAVENOUS | Status: AC
  Administered 2014-12-18: 1000 mL via INTRAVENOUS

## 2014-12-18 MED ORDER — PROCHLORPERAZINE EDISYLATE 5 MG/ML IJ SOLN
10.0000 mg | Freq: Once | INTRAMUSCULAR | Status: AC
Start: 2014-12-18 — End: 2014-12-18
  Administered 2014-12-18: 10 mg via INTRAVENOUS
  Filled 2014-12-18: qty 2

## 2014-12-18 MED ORDER — KETOROLAC TROMETHAMINE 30 MG/ML IJ SOLN
30.0000 mg | Freq: Once | INTRAMUSCULAR | Status: AC
  Administered 2014-12-18: 30 mg via INTRAVENOUS
  Filled 2014-12-18: qty 1

## 2014-12-18 MED ORDER — NAPROXEN 500 MG PO TABS: 500.0000 mg | ORAL_TABLET | Freq: Two times a day (BID) | ORAL | Status: DC

## 2014-12-18 MED ORDER — HYDROCODONE-ACETAMINOPHEN 5-325 MG PO TABS: 1.0000 | ORAL_TABLET | ORAL | Status: DC | PRN

## 2014-12-18 NOTE — ED Notes (Signed)
Patient transported to CT 

## 2014-12-18 NOTE — Discharge Instructions (Signed)
Please follow the directions provided. Use the resource guide to establish care with a primary care doctor to follow-up. Please take the naproxen twice a day to help with pain and inflammation. You may use the Vicodin for pain not relieved by the naproxen. Where your knee sleeve for comfort. Don't hesitate to return for any new, worsening, or concerning symptoms.   SEEK IMMEDIATE MEDICAL CARE IF:  You have numbness, tingling, or weakness in the arms or legs.  You develop severe headaches not relieved with medicine.  You have severe neck pain, especially tenderness in the middle of the back of your neck.  You have changes in bowel or bladder control.  There is increasing pain in any area of the body.  You have shortness of breath, light-headedness, dizziness, or fainting.  You have chest pain.  You feel sick to your stomach (nauseous), throw up (vomit), or sweat.  You have increasing abdominal discomfort.  There is blood in your urine, stool, or vomit.  You have pain in your shoulder (shoulder strap areas).  You feel your symptoms are getting worse.   Emergency Department Resource Guide 1) Find a Doctor and Pay Out of Pocket Although you won't have to find out who is covered by your insurance plan, it is a good idea to ask around and get recommendations. You will then need to call the office and see if the doctor you have chosen will accept you as a new patient and what types of options they offer for patients who are self-pay. Some doctors offer discounts or will set up payment plans for their patients who do not have insurance, but you will need to ask so you aren't surprised when you get to your appointment.  2) Contact Your Local Health Department Not all health departments have doctors that can see patients for sick visits, but many do, so it is worth a call to see if yours does. If you don't know where your local health department is, you can check in your phone book. The CDC also has a  tool to help you locate your state's health department, and many state websites also have listings of all of their local health departments.  3) Find a Walk-in Clinic If your illness is not likely to be very severe or complicated, you may want to try a walk in clinic. These are popping up all over the country in pharmacies, drugstores, and shopping centers. They're usually staffed by nurse practitioners or physician assistants that have been trained to treat common illnesses and complaints. They're usually fairly quick and inexpensive. However, if you have serious medical issues or chronic medical problems, these are probably not your best option.  No Primary Care Doctor: - Call Health Connect at  [phone] - they can help you locate a primary care doctor that  accepts your insurance, provides certain services, etc. - Physician Referral Service- [phone]  Chronic Pain Problems: Organization         Address  Phone   Notes  [facility]  [phone] Patients need to be referred by their primary care doctor.   Medication Assistance: Organization         Address  Phone   Notes  [facility] Medication [facility] [address] [place] [place], [place] [address] [phone] --Must be a resident of [facility] -- Must have NO insurance coverage whatsoever (no Medicaid/ Medicare, etc.) -- The pt. MUST have a primary care doctor that directs  their care regularly and follows them in the community   [facility]Assist  [phone]   [facility]  [phone]    Agencies that provide inexpensive medical care: Organization         Address  Phone   Notes  [facility]  [phone]   [facility]    [phone]   [facility] [address] [place], [place] [address] [phone]   [facility] [place] [address], [place] [phone]   Planned Parenthood    [phone]) [phone]    [facility]    [phone]   [facility]  [address], [place] Phone:  [phone], Fax:  [phone] Hours of Operation:  9 am - 6 pm, M-F.  Also accepts Medicaid/Medicare and self-pay.  [facility]  [address], [place] Phone: [phone], Fax: [phone]. Hours of Operation:  8:30 am - 5:30 pm, M-F.  Also accepts Medicaid and self-pay.  [facility] [place] [address], [place] Phone: [phone]) [phone]   [facility] [address], [place], [place] [phone], Ext. [phone] Mondays & Thursdays: 7-9 AM.  First 15 patients are seen on a first come, first serve basis.    Medicaid-accepting [facility] Providers:  Organization         Address  Phone   Notes  [facility] [address], [place] [phone] Also accepts self-pay patients.  [facility] [address], [place]  [phone]   [facility] [address], [place] [phone]) [phone]   [facility] [address] [place] [address], [place] [phone]   Renaye RakersVeita Bland [address], [place]   [phone]) [phone] Only accepts [place] Access [place] patients after they have their name applied to their card.   Self-Pay (no insurance) in [place] [facility]:  Organization         Address  Phone   Notes  Sickle Cell Patients, [facility] [address], [place] [phone]   [facility] [address], [place] [phone]   [facility] [place]  [address], [place] [phone]   [facility]/Dr. Osei-Bonsu  [address] [place] [address], [place] or [phone], [place] [phone] Phone number for both [place] and [place] locations is the same.  [facility] [address], [place] [phone]   [facility] [address] [place] [address], [place] or [address] [phone]) [phone]   [facility] [address], [place] [phone], phone; [phone]) [phone], fax Sees patients 1st and 3rd Saturday of every month.  Must not qualify for public or private insurance (i.e. Medicaid, Medicare, [place] Health Choice, Veterans' Benefits)  Household income should be no more than 200% of the poverty level The clinic cannot treat you if you are pregnant or think you are pregnant  Sexually transmitted diseases are not treated at the clinic.    Dental Care: Organization         Address  Phone  Notes  [facility] [address], [place] [phone] Accepts children up to  age 34 who are enrolled in Medicaid or [place] Health Choice; pregnant women with a Medicaid card; and children who have applied for Medicaid or [place] Health Choice, but were declined, whose parents can pay a reduced fee at time of service.  [facility] [place]  [address], [place] [phone] Accepts children up to age 34 who are enrolled in [place] or [place] Health Choice; pregnant women with a Medicaid card; and children who have applied for Medicaid or  Health Choice, but were declined, whose parents can pay a reduced fee at time of service.  [facility]  [address], [place] [phone]) [phone] Patients are seen by appointment only. Walk-ins are not accepted. [facility] will see patients 318 years of age and older. Monday - Tuesday (8am-5pm) Most Wednesdays (8:30-5pm) $30 per visit, cash only  [facility]  [address], [facility] [phone] Patients are seen by appointment only. Walk-ins are not accepted. [facility] will see patients 34 years of age and older. One Wednesday  Evening (Monthly: Volunteer Based).  $30 per visit, cash only  [facility]  [phone] for adults; Children under age 724, call Graduate Pediatric Dentistry at [phone]. Children aged 464-14, please call [phone] to request a pediatric application.  Dental services are provided in all areas of dental care including fillings, crowns and bridges, complete and partial dentures, implants, gum treatment, root canals, and extractions. Preventive care is also provided. Treatment is provided to both adults and children. Patients are selected via a lottery and there is often a waiting list.   [facility] [address], [place]  [phone]) [phone] [email or web address]   [facility] [address], [place], [place] [phone], Ext. [phone] Second and Fourth Thursday of each month, opens at 6:30 AM; Clinic ends at 9 AM.  Patients are seen on a first-come first-served basis, and a limited number are seen during each clinic.   [facility]  [address], [place], [place] [phone]) [phone]   Eligibility Requirements You must have lived in [place], [place], or [place] for at least the last three months.   You cannot be eligible for state or federal sponsored National Cityhealthcare insurance, including CIGNAVeterans Administration, [place]icaid, or [employer] EntertainmentMedicare.   You generally cannot be eligible for healthcare insurance through your employer.    How to apply: Eligibility screenings are held every Tuesday and Wednesday afternoon from 1:00 pm until 4:00 pm. You do not need an appointment for the interview!  [facility] [address], [place], [place] [phone]   [facility]  [phone]   [facility]  [phone]   [facility]  [phone]    Behavioral Health Resources in the Community: Intensive Outpatient Programs Organization         Address  Phone  Notes  [facility] [address], [place], [place] [phone]   [facility] [address], [place], [place] [phone]   [facility] [address], [place], [place]  [phone]   [facility] [address],  [place], [place] [phone] or [phone]   Substance Abuse Resources Organization         Address  Phone  Notes  Al[facility] Drug Services  [phone]   [facility]  [phone]   [facility] [place]  [phone]   Floydene Flock  [phone]   Residential & Outpatient Substance Abuse Program  [phone]   Psychological Services Organization         Address  Phone  Notes  [facility] Health  [phone]   [facility] Services  [phone]   [facility] Mental [facility] [address] [place] [phone] or [phone]    Mobile Crisis Teams Organization         Address  Phone  Notes  [facility], Mobile Crisis Care Unit  [phone]   [facility]  [address] [place], [place] [phone]   Doristine Locks [address] [place] [phone]    Self-Help/Support Groups Organization         Address  Phone             Notes  [facility] [place] - variety of support groups  336- I7437963 Call for more information  Narcotics Anonymous (NA), Caring Services [address], [employer] [place]  2 meetings at this location   Statistician         Address  Phone  Notes  [facility] [address],    [place]  [phone]   [facility]  [address] [place] [address], [place], [place] [phone]   [facility] [address] [place], [place] [phone] Admissions: 8am-3pm M-F  [facility] [address],    [place], [place] [phone]   [facility] [address] [place],  [place], [place] [phone]   [facility] [address],  [place], [place] [phone]   [facility] - Intensive Outpatient [address], [place], [place] [phone]   [facility] ([facility]iction Recovery [facility] [address] [place]  [place], [place] [phone] or [phone]   [facility] ([facility]) [address], [place], [place] [phone] Accepts Medicaid  [facility] [address]  [place] [phone] Substance Abuse/Addiction Treatment   [facility] Organization         Address  Phone  Notes  [facility]  [phone]   Angie Fava, PhD [address] [place] [address] [place], [place]   [phone]) [phone] or [phone]   [facility] Behavioral   [address] [place], [place] [phone]   [facility] [address] [place], [place] [phone] Insurance/Medicaid/sponsorship through [facility] and Families [address]                                    [place], [place] [phone] Therapy/tele-psych/case  [facility] [address], [place] [phone]    Dr. Lolly Mustache  [phone]   [facility]  United Way [facility] Dept. 1) [address] [place] 2) [address] [place] 3)  [address], [place] [phone]  [phone]   [facility] Child Abuse Hotline (423)083-7792 or [phone] (After Hours)

## 2014-12-18 NOTE — ED Notes (Signed)
Pt placed in gown and in bed. Pt monitored by pulse ox, bp cuff, and 5-lead. 

## 2014-12-18 NOTE — ED Notes (Signed)
Placed pt on continuous pulse oximetry and blood pressure cuff
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
## 2014-12-18 NOTE — ED Notes (Signed)
Pt left for CT and XRay

## 2014-12-18 NOTE — ED Notes (Signed)
NAD at this time. Pt is stable and driving herself home. 
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
## 2017-11-07 ENCOUNTER — Encounter: Payer: Self-pay | Admitting: Family Medicine

## 2018-01-09 ENCOUNTER — Ambulatory Visit (HOSPITAL_COMMUNITY)
Admission: EM | Admit: 2018-01-09 | Discharge: 2018-01-09 | Disposition: A | Discharge status: Home / Self Care | Payer: Medicaid Other | Attending: Internal Medicine | Admitting: Internal Medicine

## 2018-01-09 ENCOUNTER — Encounter (HOSPITAL_COMMUNITY): Payer: Self-pay | Admitting: Family Medicine

## 2018-01-09 DIAGNOSIS — N898 Other specified noninflammatory disorders of vagina: Secondary | ICD-10-CM | POA: Insufficient documentation

## 2018-01-09 DIAGNOSIS — R1030 Lower abdominal pain, unspecified: Secondary | ICD-10-CM | POA: Diagnosis not present

## 2018-01-09 DIAGNOSIS — Z113 Encounter for screening for infections with a predominantly sexual mode of transmission: Secondary | ICD-10-CM

## 2018-01-09 DIAGNOSIS — R300 Dysuria: Secondary | ICD-10-CM | POA: Diagnosis not present

## 2018-01-09 DIAGNOSIS — F17210 Nicotine dependence, cigarettes, uncomplicated: Secondary | ICD-10-CM | POA: Diagnosis not present

## 2018-01-09 DIAGNOSIS — K5900 Constipation, unspecified: Secondary | ICD-10-CM | POA: Diagnosis not present

## 2018-01-09 DIAGNOSIS — R350 Frequency of micturition: Secondary | ICD-10-CM | POA: Diagnosis not present

## 2018-01-09 DIAGNOSIS — M545 Low back pain, unspecified: Secondary | ICD-10-CM

## 2018-01-09 DIAGNOSIS — Z3202 Encounter for pregnancy test, result negative: Secondary | ICD-10-CM

## 2018-01-09 LAB — POCT URINALYSIS DIP (DEVICE)
Bilirubin Urine: NEGATIVE
Glucose, UA: NEGATIVE mg/dL
LEUKOCYTES UA: NEGATIVE
Nitrite: NEGATIVE
Protein, ur: 30 mg/dL — AB
SPECIFIC GRAVITY, URINE: 1.025 (ref 1.005–1.030)
UROBILINOGEN UA: 0.2 mg/dL (ref 0.0–1.0)
pH: 6 (ref 5.0–8.0)

## 2018-01-09 LAB — POCT PREGNANCY, URINE: Preg Test, Ur: NEGATIVE

## 2018-01-09 MED ORDER — CYCLOBENZAPRINE HCL 5 MG PO TABS: 5.0000 mg | ORAL_TABLET | Freq: Three times a day (TID) | ORAL | 0 refills | Status: DC | PRN

## 2018-01-09 MED ORDER — CEFTRIAXONE SODIUM 250 MG IJ SOLR
INTRAMUSCULAR | Status: AC
  Filled 2018-01-09: qty 250

## 2018-01-09 MED ORDER — CEFTRIAXONE SODIUM 250 MG IJ SOLR
250.0000 mg | Freq: Once | INTRAMUSCULAR | Status: AC
  Administered 2018-01-09: 250 mg via INTRAMUSCULAR

## 2018-01-09 MED ORDER — AZITHROMYCIN 250 MG PO TABS
ORAL_TABLET | ORAL | Status: AC
  Filled 2018-01-09: qty 4

## 2018-01-09 MED ORDER — METRONIDAZOLE 500 MG PO TABS: 500.0000 mg | ORAL_TABLET | Freq: Two times a day (BID) | ORAL | 0 refills | Status: DC

## 2018-01-09 MED ORDER — AZITHROMYCIN 250 MG PO TABS
1000.0000 mg | ORAL_TABLET | Freq: Once | ORAL | Status: AC
  Administered 2018-01-09: 1000 mg via ORAL

## 2018-01-09 NOTE — ED Provider Notes (Signed)
MRN: [id number] DOB: 08-09-1981  Subjective:   Nasirah Sachs is a 37 y.o. female presenting for 1 month history of intermittent dysuria, urinary frequency, occasional hematuria, achy low back pain, lower belly pain, intermittent mild dizziness.  Also reports persistent malodorous vaginal discharge.  Patient would like STI testing, was sexually active until the last month and try to use condoms consistently.  Patient has not tried medications for relief.  Admits that she does not drink water, drinks sodas and sweet tea.  Patient also struggles with constipation, does not eat fiber.  Eats mostly junk food.  Denies fever, headaches, sore throat, cough, chest pain, vomiting, diarrhea, bloody stools, rashes.  Smokes cigarettes on occasion, about 3/week.  Nastasia is not currently taking any medications.  She No Known Allergies. Has pmh of anemia. Denies family history of diabetes.   Past Surgical History:  Procedure Laterality Date  . CESAREAN SECTION    . SKIN BIOPSY      Objective:   Vitals: BP 123/86   Pulse 85   Temp 98.5 F (36.9 C)   Resp 18   LMP 01/02/2018   SpO2 100%   Physical Exam  Constitutional: She is oriented to person, place, and time. She appears well-developed and well-nourished.  HENT:  Mouth/Throat: Oropharynx is clear and moist.  Eyes: Pupils are equal, round, and reactive to light. EOM are normal. Right eye exhibits no discharge. Left eye exhibits no discharge. No scleral icterus.  Cardiovascular: Normal rate, regular rhythm and intact distal pulses. Exam reveals no gallop and no friction rub.  No murmur heard. Pulmonary/Chest: No respiratory distress. She has no wheezes. She has no rales.  Abdominal: Soft. Bowel sounds are normal. She exhibits no distension and no mass. There is no tenderness. There is no rebound and no guarding.  Musculoskeletal: She exhibits no edema.       Lumbar back: She exhibits tenderness (over area depicted). She exhibits normal range of  motion, no bony tenderness, no swelling, no edema and no spasm.       Back:  Neurological: She is alert and oriented to person, place, and time. Coordination normal.  Skin: Skin is warm and dry. No rash noted. No erythema. No pallor.  Psychiatric: She has a normal mood and affect.   Results for orders placed or performed during the hospital encounter of 01/09/18 (from the past 24 hour(s))  POCT urinalysis dip (device)     Status: Abnormal   Collection Time: 01/09/18 12:45 PM  Result Value Ref Range   Glucose, UA NEGATIVE NEGATIVE mg/dL   Bilirubin Urine NEGATIVE NEGATIVE   Ketones, ur TRACE (A) NEGATIVE mg/dL   Specific Gravity, Urine 1.025 1.005 - 1.030   Hgb urine dipstick MODERATE (A) NEGATIVE   pH 6.0 5.0 - 8.0   Protein, ur 30 (A) NEGATIVE mg/dL   Urobilinogen, UA 0.2 0.0 - 1.0 mg/dL   Nitrite NEGATIVE NEGATIVE   Leukocytes, UA NEGATIVE NEGATIVE  Pregnancy, urine POC     Status: None   Collection Time: 01/09/18 12:51 PM  Result Value Ref Range   Preg Test, Ur NEGATIVE NEGATIVE    Assessment and Plan :   Dysuria  Vaginal discharge  Urinary frequency  Lower abdominal pain  Acute bilateral low back pain without sciatica  Constipation, unspecified constipation type  Counseled patient extensively on lifestyle modifications including hydrating with water, practicing healthy diet.  Offered prescription for Flexeril for her back pain but also counseled that she might do a lot better with  adequate hydration daily without needing pain medicine.  Send instructions for Tylenol and ibuprofen provided.  Will treat empirically for STI with Rocephin and azithromycin.  We will also address BV with Flagyl.  Labs pending. Counseled patient on potential for adverse effects with medications prescribed today, patient verbalized understanding. Return-to-clinic precautions discussed, patient verbalized understanding.    Wallis BambergMani, Harshitha Fretz, PA-C 01/09/18 1329

## 2018-01-09 NOTE — Discharge Instructions (Addendum)
Hydrate well with at least 2 liters (1 gallon) of water daily. You may take 500mg  Tylenol with ibuprofen 400-600mg  every 6 hours for pain and inflammation.   Salads - kale, spinach, cabbage, spring mix; use seeds like pumpkin seeds or sunflower seeds; you can also use 1-2 hard boiled eggs. Fruits - avocadoes, berries (blueberries, raspberries, blackberries), apples, oranges, pomegranate, grapefruit Seeds - quinoa, chia seeds; you can also incorporate oatmeal Vegetables - aspargus, cauliflower, broccoli, green beans, brussel spouts, bell peppers; stay away from starchy vegetables like potatoes, carrots, peas   Brussel sprouts - Cut off stems. Place in a mixing bowl that has a lid. Pour in a 1/4-1/2 cup olive oil, spices, use a light amount of parmesan. Place on a baking sheet. Bake for 10 minutes at 400F. Take it out, eat the brussel chips. Place for another 5-10 minutes.   Mashed cauliflower - Boil a bunch of cauliflower in a pot of water. Blend in a food processor with 1-2 tablespoons of butter.  Spaghetti squash -  Cut the squash in half very carefully, clean out seeds from the middle. Place 1/2 face down in a microwave safe dish with at least 2 inches of water. Make 4-6 slits on outside of spaghetti squash and microwave for 10-12 minutes. Take out the spaghetti using a metal spoon. Repeat for the other half.    Vega protein is good protein powder, make sure you use ~6 ice cubes to give it smoothie consistency together with ~4-6 ounces of vanilla soy milk. Throw cinnamon into your shake, use peanut butter. You can also use the fruits that I listed above. Throw spinach or kale into the shake.

## 2018-01-09 NOTE — ED Triage Notes (Addendum)
Pt here for dysuria x 1 month with odor. Reports also that she would like STD testing  To include HIV. Some lower abd pain and back pain.

## 2018-01-10 ENCOUNTER — Telehealth (HOSPITAL_COMMUNITY): Payer: Self-pay

## 2018-01-10 LAB — RPR: RPR Ser Ql: NONREACTIVE

## 2018-01-10 LAB — URINE CYTOLOGY ANCILLARY ONLY
CHLAMYDIA, DNA PROBE: NEGATIVE
Neisseria Gonorrhea: NEGATIVE
Trichomonas: NEGATIVE

## 2018-01-10 LAB — URINE CULTURE: Culture: NO GROWTH

## 2018-01-10 LAB — HIV ANTIBODY (ROUTINE TESTING W REFLEX): HIV Screen 4th Generation wRfx: NONREACTIVE

## 2018-01-10 NOTE — Telephone Encounter (Signed)
Results are within normal range. Attempted to contact patient, no answer and voicemail not set up.

## 2018-01-11 ENCOUNTER — Telehealth (HOSPITAL_COMMUNITY): Payer: Self-pay | Admitting: Emergency Medicine

## 2018-01-11 LAB — URINE CYTOLOGY ANCILLARY ONLY

## 2018-01-11 NOTE — Telephone Encounter (Signed)
Pt here requesting print out of results.  Paperwork signed and results printed

## 2018-01-18 ENCOUNTER — Encounter: Payer: Medicaid Other | Admitting: Obstetrics & Gynecology

## 2023-01-20 ENCOUNTER — Telehealth (HOSPITAL_COMMUNITY): Payer: Self-pay

## 2023-01-20 ENCOUNTER — Ambulatory Visit (HOSPITAL_COMMUNITY)
Admission: EM | Admit: 2023-01-20 | Discharge: 2023-01-20 | Disposition: A | Discharge status: Home / Self Care | Payer: Commercial Managed Care - HMO | Attending: Emergency Medicine | Admitting: Emergency Medicine

## 2023-01-20 ENCOUNTER — Encounter (HOSPITAL_COMMUNITY): Payer: Self-pay | Admitting: *Deleted

## 2023-01-20 DIAGNOSIS — H60391 Other infective otitis externa, right ear: Secondary | ICD-10-CM | POA: Insufficient documentation

## 2023-01-20 DIAGNOSIS — H66001 Acute suppurative otitis media without spontaneous rupture of ear drum, right ear: Secondary | ICD-10-CM | POA: Diagnosis present

## 2023-01-20 LAB — POCT RAPID STREP A (OFFICE): Rapid Strep A Screen: NEGATIVE

## 2023-01-20 MED ORDER — ACETAMINOPHEN 325 MG PO TABS
ORAL_TABLET | ORAL | Status: AC
  Filled 2023-01-20: qty 3

## 2023-01-20 MED ORDER — OFLOXACIN 0.3 % OT SOLN: 2.0000 [drp] | Freq: Two times a day (BID) | OTIC | 0 refills | Status: DC

## 2023-01-20 MED ORDER — AMOXICILLIN 500 MG PO CAPS: 500.0000 mg | ORAL_CAPSULE | Freq: Two times a day (BID) | ORAL | 0 refills | Status: DC

## 2023-01-20 MED ORDER — AMOXICILLIN 500 MG PO CAPS: 500.0000 mg | ORAL_CAPSULE | Freq: Two times a day (BID) | ORAL | 0 refills | Status: AC

## 2023-01-20 MED ORDER — ACETAMINOPHEN 325 MG PO TABS
650.0000 mg | ORAL_TABLET | Freq: Once | ORAL | Status: AC
  Administered 2023-01-20: 650 mg via ORAL

## 2023-01-20 NOTE — Discharge Instructions (Addendum)
Please take medication as prescribed. Take with food to avoid upset stomach. There is a goodRx coupon attached although the medicine may be cheaper through the pharmacy price.   Use the ear drops twice daily for the next 5 days. These should be affordable at the pharmacy.   Please follow with the ENT if symptoms are persisting

## 2023-01-20 NOTE — ED Triage Notes (Signed)
Pt states she has right ear pain and a sore throat X 1 month, She states she is taking no meds.   Pt states she doesn't have insurance and she would like free meds.

## 2023-01-20 NOTE — ED Notes (Signed)
Pt will come back to get her paperwork and coupons.

## 2023-01-20 NOTE — ED Provider Notes (Signed)
[facility]    CSN: [id number] Arrival date & time: 01/20/23  1651      History   Chief Complaint Chief Complaint  Patient presents with   Otalgia   Sore Throat    HPI Peggy Reid is a 42 y.o. female.  Here with 1 month history of bilateral ear pain and sore throat Reports worse on the right side.  She feels the ear pain travels down into the throat Some drainage from the right ear. No fevers, cough, or rash Has not taken any medications  History reviewed. No pertinent past medical history.  There are no problems to display for this patient.   Past Surgical History:  Procedure Laterality Date   CESAREAN SECTION     SKIN BIOPSY      OB History   No obstetric history on file.      Home Medications    Prior to Admission medications   Medication Sig Start Date End Date Taking? Authorizing Provider  amoxicillin (AMOXIL) 500 MG capsule Take 1 capsule (500 mg total) by mouth 2 (two) times daily for 5 days. 01/20/23 01/25/23  Giorgi Debruin, Lurena Joiner, PA-C  ofloxacin (FLOXIN) 0.3 % OTIC solution Place 2 drops into both ears 2 (two) times daily. 01/20/23   Christol Thetford, Ray Church    Family History History reviewed. No pertinent family history.  Social History Social History   Tobacco Use   Smoking status: Some Days   Smokeless tobacco: Never  Substance Use Topics   Alcohol use: No   Drug use: Yes    Types: Marijuana     Allergies   Patient has no known allergies.   Review of Systems Review of Systems  HENT:  Positive for ear pain.    As per HPI  Physical Exam Triage Vital Signs ED Triage Vitals  Enc Vitals Group     BP 01/20/23 1707 115/82     Pulse Rate 01/20/23 1707 97     Resp 01/20/23 1707 18     Temp 01/20/23 1707 98.7 F (37.1 C)     Temp Source 01/20/23 1707 Oral     SpO2 01/20/23 1707 96 %     Weight --      Height --      Head Circumference --      Peak Flow --      Pain Score 01/20/23 1705 10     Pain Loc --      Pain Edu? --       Excl. in GC? --    No data found.  Updated Vital Signs BP 115/82 (BP Location: Right Arm)   Pulse 97   Temp 98.7 F (37.1 C) (Oral)   Resp 18   LMP 01/06/2023 (Exact Date)   SpO2 96%   Physical Exam Vitals and nursing note reviewed.  Constitutional:      General: She is not in acute distress. HENT:     Mouth/Throat:     Mouth: Mucous membranes are moist.     Pharynx: Posterior oropharyngeal erythema present.     Tonsils: No tonsillar exudate.  Eyes:     Conjunctiva/sclera: Conjunctivae normal.  Cardiovascular:     Rate and Rhythm: Normal rate and regular rhythm.  Pulmonary:     Effort: Pulmonary effort is normal.  Lymphadenopathy:     Cervical: No cervical adenopathy.  Skin:    General: Skin is warm and dry.  Neurological:     Mental Status: She is alert and oriented  to person, place, and time.   HENT:     Right Ear: Drainage present. No mastoid tenderness. Tympanic membrane is erythematous.     Ears:     Comments: Purulent drainage in the right ear canal, TM appears erythematous.  There is some cloudy fluid behind the left TM    Mouth/Throat:     Mouth: Mucous membranes are moist.      UC Treatments / Results  Labs (all labs ordered are listed, but only abnormal results are displayed) Labs Reviewed  CULTURE, GROUP A STREP [facility])  POCT RAPID STREP A (OFFICE)    EKG   Radiology No results found.  Procedures Procedures (including critical care time)  Medications Ordered in UC Medications  acetaminophen (TYLENOL) tablet 650 mg (650 mg Oral Given 01/20/23 1728)    Initial Impression / Assessment and Plan / UC Course  I have reviewed the triage vital signs and the nursing notes.  Pertinent labs & imaging results that were available during my care of the patient were reviewed by me and considered in my medical decision making (see chart for details).  Afebrile, well-appearing. Rapid strep negative, culture is pending. Treat for acute otitis  media and externa.  Amoxicillin BID x 5 days, ofloxacin eardrops BID x 5.  Provided with ENT clinic for follow-up given her month-long symptoms.  Return precautions discussed.  Final Clinical Impressions(s) / UC Diagnoses   Final diagnoses:  Infective otitis externa of right ear  Acute suppurative otitis media of right ear without spontaneous rupture of tympanic membrane, recurrence not specified     Discharge Instructions      Please take medication as prescribed. Take with food to avoid upset stomach. There is a goodRx coupon attached although the medicine may be cheaper through the pharmacy price.   Use the ear drops twice daily for the next 5 days. These should be affordable at the pharmacy.   Please follow with the ENT if symptoms are persisting      ED Prescriptions     Medication Sig Dispense Auth. Provider   amoxicillin (AMOXIL) 500 MG capsule Take 1 capsule (500 mg total) by mouth 2 (two) times daily for 5 days. 10 capsule Donnelle Olmeda, PA-C   ofloxacin (FLOXIN) 0.3 % OTIC solution Place 2 drops into both ears 2 (two) times daily. 5 mL Ajanae Virag, Lurena Joiner, PA-C      PDMP not reviewed this encounter.   Manette Doto, Ray Church 01/20/23 2003

## 2023-01-21 LAB — CULTURE, GROUP A STREP (THRC)

## 2023-01-22 LAB — CULTURE, GROUP A STREP (THRC)

## 2023-09-18 ENCOUNTER — Emergency Department (HOSPITAL_COMMUNITY): Payer: Commercial Managed Care - HMO

## 2023-09-18 ENCOUNTER — Observation Stay (HOSPITAL_COMMUNITY)
Admission: EM | Admit: 2023-09-18 | Discharge: 2023-09-19 | Discharge status: Against Medical Advice | Payer: Commercial Managed Care - HMO | Attending: Internal Medicine | Admitting: Internal Medicine

## 2023-09-18 ENCOUNTER — Other Ambulatory Visit: Payer: Self-pay

## 2023-09-18 DIAGNOSIS — D649 Anemia, unspecified: Secondary | ICD-10-CM

## 2023-09-18 DIAGNOSIS — S0990XA Unspecified injury of head, initial encounter: Secondary | ICD-10-CM | POA: Diagnosis not present

## 2023-09-18 DIAGNOSIS — R6510 Systemic inflammatory response syndrome (SIRS) of non-infectious origin without acute organ dysfunction: Secondary | ICD-10-CM | POA: Diagnosis present

## 2023-09-18 DIAGNOSIS — D62 Acute posthemorrhagic anemia: Principal | ICD-10-CM | POA: Diagnosis present

## 2023-09-18 DIAGNOSIS — F17200 Nicotine dependence, unspecified, uncomplicated: Secondary | ICD-10-CM | POA: Diagnosis not present

## 2023-09-18 DIAGNOSIS — R109 Unspecified abdominal pain: Secondary | ICD-10-CM | POA: Diagnosis not present

## 2023-09-18 DIAGNOSIS — K922 Gastrointestinal hemorrhage, unspecified: Secondary | ICD-10-CM | POA: Diagnosis present

## 2023-09-18 DIAGNOSIS — D72829 Elevated white blood cell count, unspecified: Secondary | ICD-10-CM | POA: Diagnosis not present

## 2023-09-18 DIAGNOSIS — N939 Abnormal uterine and vaginal bleeding, unspecified: Secondary | ICD-10-CM | POA: Diagnosis present

## 2023-09-18 HISTORY — DX: Iron deficiency anemia secondary to blood loss (chronic): D50.0

## 2023-09-18 HISTORY — DX: Excessive and frequent menstruation with regular cycle: N92.0

## 2023-09-18 LAB — COMPREHENSIVE METABOLIC PANEL
ALT: 11 U/L (ref 0–44)
AST: 19 U/L (ref 15–41)
Albumin: 3.1 g/dL — ABNORMAL LOW (ref 3.5–5.0)
Alkaline Phosphatase: 84 U/L (ref 38–126)
Anion gap: 11 (ref 5–15)
BUN: 11 mg/dL (ref 6–20)
CO2: 18 mmol/L — ABNORMAL LOW (ref 22–32)
Calcium: 8.9 mg/dL (ref 8.9–10.3)
Chloride: 111 mmol/L (ref 98–111)
Creatinine, Ser: 1.05 mg/dL — ABNORMAL HIGH (ref 0.44–1.00)
GFR, Estimated: >60 mL/min (ref >60)
Glucose, Bld: 92 mg/dL (ref 70–99)
Potassium: 3.5 mmol/L (ref 3.5–5.1)
Sodium: 140 mmol/L (ref 135–145)
Total Bilirubin: 0.7 mg/dL (ref <1.2)
Total Protein: 6.9 g/dL (ref 6.5–8.1)

## 2023-09-18 LAB — CBC
HCT: 25.4 % — ABNORMAL LOW (ref 36.0–46.0)
Hemoglobin: 6 g/dL — CL (ref 12.0–15.0)
MCH: 15.7 pg — ABNORMAL LOW (ref 26.0–34.0)
MCHC: 23.6 g/dL — ABNORMAL LOW (ref 30.0–36.0)
MCV: 66.7 fL — ABNORMAL LOW (ref 80.0–100.0)
Platelets: 429 10*3/uL — ABNORMAL HIGH (ref 150–400)
RBC: 3.81 MIL/uL — ABNORMAL LOW (ref 3.87–5.11)
RDW: 21.2 % — ABNORMAL HIGH (ref 11.5–15.5)
WBC: 13.6 10*3/uL — ABNORMAL HIGH (ref 4.0–10.5)
nRBC: 0 % (ref 0.0–0.2)

## 2023-09-18 LAB — PREPARE RBC (CROSSMATCH)

## 2023-09-18 LAB — I-STAT CHEM 8, ED
BUN: 10 mg/dL (ref 6–20)
Calcium, Ion: 1.16 mmol/L (ref 1.15–1.40)
Chloride: 110 mmol/L (ref 98–111)
Creatinine, Ser: 1 mg/dL (ref 0.44–1.00)
Glucose, Bld: 89 mg/dL (ref 70–99)
HCT: 24 % — ABNORMAL LOW (ref 36.0–46.0)
Hemoglobin: 8.2 g/dL — ABNORMAL LOW (ref 12.0–15.0)
Potassium: 3.5 mmol/L (ref 3.5–5.1)
Sodium: 141 mmol/L (ref 135–145)
TCO2: 20 mmol/L — ABNORMAL LOW (ref 22–32)

## 2023-09-18 LAB — PROTIME-INR
INR: 1.1 (ref 0.8–1.2)
Prothrombin Time: 14.6 s (ref 11.4–15.2)

## 2023-09-18 LAB — ABO/RH: ABO/RH(D): O POS

## 2023-09-18 LAB — MAGNESIUM: Magnesium: 2.4 mg/dL (ref 1.7–2.4)

## 2023-09-18 LAB — HEMOGLOBIN AND HEMATOCRIT, BLOOD
HCT: 25.6 % — ABNORMAL LOW (ref 36.0–46.0)
Hemoglobin: 6.5 g/dL — CL (ref 12.0–15.0)

## 2023-09-18 LAB — WET PREP, GENITAL
Clue Cells Wet Prep HPF POC: NONE SEEN
Sperm: NONE SEEN
Trich, Wet Prep: NONE SEEN
WBC, Wet Prep HPF POC: <10 (ref <10)
Yeast Wet Prep HPF POC: NONE SEEN

## 2023-09-18 LAB — ETHANOL: Alcohol, Ethyl (B): <10 mg/dL (ref <10)

## 2023-09-18 LAB — IRON AND TIBC
Iron: 21 ug/dL — ABNORMAL LOW (ref 28–170)
Saturation Ratios: 6 % — ABNORMAL LOW (ref 10.4–31.8)
TIBC: 368 ug/dL (ref 250–450)
UIBC: 347 ug/dL

## 2023-09-18 LAB — APTT: aPTT: 33 s (ref 24–36)

## 2023-09-18 LAB — SAMPLE TO BLOOD BANK

## 2023-09-18 LAB — OCCULT BLOOD X 1 CARD TO LAB, STOOL: Fecal Occult Bld: POSITIVE — AB

## 2023-09-18 LAB — FERRITIN: Ferritin: 2 ng/mL — ABNORMAL LOW (ref 11–307)

## 2023-09-18 LAB — HCG, SERUM, QUALITATIVE: Preg, Serum: NEGATIVE

## 2023-09-18 MED ORDER — IOHEXOL 350 MG/ML SOLN
75.0000 mL | Freq: Once | INTRAVENOUS | Status: AC | PRN
  Administered 2023-09-18: 75 mL via INTRAVENOUS

## 2023-09-18 MED ORDER — ONDANSETRON HCL 4 MG/2ML IJ SOLN: 4.0000 mg | Freq: Four times a day (QID) | INTRAMUSCULAR | Status: DC | PRN

## 2023-09-18 MED ORDER — ACETAMINOPHEN 325 MG PO TABS
650.0000 mg | ORAL_TABLET | Freq: Four times a day (QID) | ORAL | Status: DC | PRN
  Administered 2023-09-18: 650 mg via ORAL
  Filled 2023-09-18: qty 2

## 2023-09-18 MED ORDER — ONDANSETRON HCL 4 MG/2ML IJ SOLN
4.0000 mg | Freq: Once | INTRAMUSCULAR | Status: AC
  Administered 2023-09-18: 4 mg via INTRAVENOUS
  Filled 2023-09-18: qty 2

## 2023-09-18 MED ORDER — MELATONIN 3 MG PO TABS
3.0000 mg | ORAL_TABLET | Freq: Every evening | ORAL | Status: DC | PRN
Start: 2023-09-18 — End: 2023-09-19

## 2023-09-18 MED ORDER — PANTOPRAZOLE SODIUM 40 MG IV SOLR: 40.0000 mg | INTRAVENOUS | Status: AC

## 2023-09-18 MED ORDER — SODIUM CHLORIDE 0.9% IV SOLUTION: Freq: Once | INTRAVENOUS | Status: AC

## 2023-09-18 MED ORDER — TRANEXAMIC ACID 650 MG PO TABS
1300.0000 mg | ORAL_TABLET | Freq: Three times a day (TID) | ORAL | Status: DC
  Filled 2023-09-18: qty 2

## 2023-09-18 MED ORDER — ACETAMINOPHEN 650 MG RE SUPP: 650.0000 mg | Freq: Four times a day (QID) | RECTAL | Status: DC | PRN

## 2023-09-18 MED ORDER — HYDROMORPHONE HCL 1 MG/ML IJ SOLN
0.5000 mg | Freq: Once | INTRAMUSCULAR | Status: AC
Start: 2023-09-18 — End: 2023-09-18
  Administered 2023-09-18: 0.5 mg via INTRAVENOUS
  Filled 2023-09-18: qty 1

## 2023-09-18 MED ORDER — TRANEXAMIC ACID 650 MG PO TABS
1300.0000 mg | ORAL_TABLET | Freq: Once | ORAL | Status: AC
  Administered 2023-09-18: 1300 mg via ORAL
  Filled 2023-09-18: qty 2

## 2023-09-18 MED ORDER — PANTOPRAZOLE SODIUM 40 MG IV SOLR: 40.0000 mg | Freq: Two times a day (BID) | INTRAVENOUS | Status: DC
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
## 2023-09-18 NOTE — ED Notes (Signed)
[employer] gave phone to hospital security.
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
## 2023-09-19 ENCOUNTER — Encounter (HOSPITAL_COMMUNITY): Payer: Self-pay | Admitting: Internal Medicine

## 2023-09-19 DIAGNOSIS — N939 Abnormal uterine and vaginal bleeding, unspecified: Principal | ICD-10-CM | POA: Diagnosis present

## 2023-09-19 DIAGNOSIS — D72829 Elevated white blood cell count, unspecified: Secondary | ICD-10-CM | POA: Diagnosis present

## 2023-09-19 DIAGNOSIS — K922 Gastrointestinal hemorrhage, unspecified: Secondary | ICD-10-CM | POA: Diagnosis present

## 2023-09-19 DIAGNOSIS — R6510 Systemic inflammatory response syndrome (SIRS) of non-infectious origin without acute organ dysfunction: Secondary | ICD-10-CM | POA: Diagnosis present

## 2023-09-19 LAB — TYPE AND SCREEN
ABO/RH(D): O POS
Antibody Screen: NEGATIVE
Unit division: 0

## 2023-09-19 LAB — BPAM RBC
Blood Product Expiration Date: 202501182359
ISSUE DATE / TIME: 202412222005
Unit Type and Rh: 5100

## 2023-09-19 NOTE — Progress Notes (Addendum)
Pt. Came up to the floor from [facility] ED. Alone. Pt A&O x 4 Assessment done. Informed Pt I would return to put her on a monitor. Upon returning. Pt was not in the room. Searched for the Pt. Nowhere to be found. Informed ED. Security, MD and [facility] informed.

## 2023-09-20 LAB — GC/CHLAMYDIA PROBE AMP (~~LOC~~) NOT AT ARMC
Chlamydia: NEGATIVE
Comment: NEGATIVE
Comment: NORMAL
Neisseria Gonorrhea: NEGATIVE

## 2023-11-02 ENCOUNTER — Emergency Department (HOSPITAL_COMMUNITY)
Admission: EM | Admit: 2023-11-02 | Discharge: 2023-11-03 | Attending: Emergency Medicine | Admitting: Emergency Medicine

## 2023-11-02 ENCOUNTER — Other Ambulatory Visit: Payer: Self-pay

## 2023-11-02 ENCOUNTER — Encounter (HOSPITAL_COMMUNITY): Payer: Self-pay | Admitting: Emergency Medicine

## 2023-11-02 DIAGNOSIS — K0889 Other specified disorders of teeth and supporting structures: Secondary | ICD-10-CM | POA: Insufficient documentation

## 2023-11-02 DIAGNOSIS — D649 Anemia, unspecified: Secondary | ICD-10-CM | POA: Insufficient documentation

## 2023-11-02 LAB — BASIC METABOLIC PANEL
Anion gap: 9 (ref 5–15)
BUN: 17 mg/dL (ref 6–20)
CO2: 24 mmol/L (ref 22–32)
Calcium: 8.4 mg/dL — ABNORMAL LOW (ref 8.9–10.3)
Chloride: 107 mmol/L (ref 98–111)
Creatinine, Ser: 0.67 mg/dL (ref 0.44–1.00)
GFR, Estimated: >60 mL/min (ref >60)
Glucose, Bld: 94 mg/dL (ref 70–99)
Potassium: 3.6 mmol/L (ref 3.5–5.1)
Sodium: 140 mmol/L (ref 135–145)

## 2023-11-02 LAB — CBC
HCT: 23.2 % — ABNORMAL LOW (ref 36.0–46.0)
Hemoglobin: 5.8 g/dL — CL (ref 12.0–15.0)
MCH: 16.7 pg — ABNORMAL LOW (ref 26.0–34.0)
MCHC: 25 g/dL — ABNORMAL LOW (ref 30.0–36.0)
MCV: 66.9 fL — ABNORMAL LOW (ref 80.0–100.0)
Platelets: 329 10*3/uL (ref 150–400)
RBC: 3.47 MIL/uL — ABNORMAL LOW (ref 3.87–5.11)
RDW: 21.4 % — ABNORMAL HIGH (ref 11.5–15.5)
WBC: 6.8 10*3/uL (ref 4.0–10.5)
nRBC: 0 % (ref 0.0–0.2)

## 2023-11-02 LAB — PREPARE RBC (CROSSMATCH)

## 2023-11-02 MED ORDER — KETOROLAC TROMETHAMINE 30 MG/ML IJ SOLN
15.0000 mg | Freq: Once | INTRAMUSCULAR | Status: AC
  Administered 2023-11-02: 15 mg via INTRAVENOUS
  Filled 2023-11-02: qty 1

## 2023-11-02 MED ORDER — SODIUM CHLORIDE 0.9% IV SOLUTION: Freq: Once | INTRAVENOUS | Status: AC

## 2023-11-02 NOTE — ED Provider Notes (Signed)
 [place] EMERGENCY DEPARTMENT AT [facility] Provider Note   CSN: [id number] Arrival date & time: 11/02/23  1821     History  Chief Complaint  Patient presents with   abnormal labs   Oral Pain    Zaleah Aleira Deiter is a 43 y.o. female.  HPI Patient in police custody presents with concern for anemia.  Reportedly patient was found to be anemic on blood [facility] at jail.  Patient does not have paper corroborating this, does state that she has been transfused in the past.  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  She states this is uncomfortable, and she gets fluid passing from her nasal to her oral cavity frequently.  No fever, fall, chest pain, abdominal pain.    Home Medications Prior to Admission medications   Not on File      Allergies    Patient has no known allergies.    Review of Systems   Review of Systems  Physical Exam Updated Vital Signs BP 120/63   Pulse 89   Temp (!) 97.3 F (36.3 C) (Oral)   Resp 18   Ht 5' 7 (1.702 m)   Wt 81.6 kg   SpO2 100%   BMI 28.19 kg/m  Physical Exam Vitals and nursing note reviewed.  Constitutional:      General: She is not in acute distress.    Appearance: She is well-developed.  HENT:     Head: Normocephalic.     Mouth/Throat:   Eyes:     Conjunctiva/sclera: Conjunctivae normal.  Cardiovascular:     Rate and Rhythm: Normal rate and regular rhythm.  Pulmonary:     Effort: Pulmonary effort is normal. No respiratory distress.     Breath sounds: Normal breath sounds. No stridor.  Abdominal:     General: There is no distension.  Skin:    General: Skin is warm and dry.  Neurological:     Mental Status: She is alert and oriented to person, place, and time.     Cranial Nerves: No cranial nerve deficit.  Psychiatric:        Mood and Affect: Mood normal.     ED Results / Procedures / Treatments   Labs (all labs ordered are listed, but only abnormal results are  displayed) Labs Reviewed  BASIC METABOLIC PANEL - Abnormal; Notable for the following components:      Result Value   Calcium 8.4 (*)    All other components within normal limits  CBC - Abnormal; Notable for the following components:   RBC 3.47 (*)    Hemoglobin 5.8 (*)    HCT 23.2 (*)    MCV 66.9 (*)    MCH 16.7 (*)    MCHC 25.0 (*)    RDW 21.4 (*)    All other components within normal limits  TYPE AND SCREEN  PREPARE RBC (CROSSMATCH)    EKG None  Radiology No results found.  Procedures Procedures    Medications Ordered in ED Medications  0.9 %  sodium chloride  infusion (Manually program via Guardrails IV Fluids) (has no administration in time range)  ketorolac  (TORADOL ) 30 MG/ML injection 15 mg (has no administration in time range)    ED Course/ Medical Decision Making/ A&P                                 Medical Decision Making Adult  female presents with concern for anemia.  Patient denies any obvious sources of bleeding, but with outside hospital results concerning for anemia, labs were ordered. Patient is awake, alert, minimally hypotensive, but not tachycardic suggesting chronic, not acute process. Pulse ox 99% room air normal   Amount and/or Complexity of Data Reviewed Independent Historian:     Details: Print production planner External Data Reviewed: notes. Labs: ordered. Decision-making details documented in ED Course.  Risk Prescription drug management.  Update: Patient with labs concerning for critical anemia 5.8 hemoglobin, type and screen been sent, patient will receive blood transfusion.  Patient's other labs unremarkable.  10:51 PM Transfusion started.  11:39 PM Patient in no distress, awake, alert.  With no applications of transfusion, patient may be appropriate to return to incarceration.  Final Clinical Impression(s) / ED Diagnoses Final diagnoses:  Symptomatic anemia   CRITICAL CARE Performed by: Lamar Salen Total critical care time:  35 minutes Critical care time was exclusive of separately billable procedures and treating other patients. Critical care was necessary to treat or prevent imminent or life-threatening deterioration. Critical care was time spent personally by me on the following activities: development of treatment plan with patient and/or surrogate as well as nursing, discussions with consultants, evaluation of patient's response to treatment, examination of patient, obtaining history from patient or surrogate, ordering and performing treatments and interventions, ordering and review of laboratory studies, ordering and review of radiographic studies, pulse oximetry and re-evaluation of patient's condition.     Salen Lamar, MD 11/02/23 [phone]

## 2023-11-02 NOTE — ED Triage Notes (Signed)
 Patient comes from jail for low hemoglobin. She also states that she has a throat and mouth issue where her mouth doesn't feel right is swollen and has a smell.

## 2023-11-02 NOTE — Discharge Instructions (Addendum)
Be sure to follow-up with your physician. °

## 2023-11-03 MED ORDER — MENTHOL 3 MG MT LOZG
1.0000 | LOZENGE | OROMUCOSAL | Status: DC | PRN
  Administered 2023-11-03: 3 mg via ORAL
  Filled 2023-11-03: qty 9

## 2023-11-03 NOTE — ED Notes (Signed)
 Pt left ED around 1335, charted off board at this time
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
## 2023-11-03 NOTE — ED Notes (Signed)
Patient states she vomited in the bathroom

## 2023-11-03 NOTE — ED Provider Notes (Signed)
 Received patient in turnover from Dr. Garrick.  Please see their note for further details of Hx, PE.  Briefly patient is a 43 y.o. female with a abnormal labs and Oral Pain .  Patient found to be anemic at jail.  Plan to give a unit of blood and discharged back to jail.SABRA Quale, Rolan, DO 11/03/23 0010

## 2023-11-04 LAB — TYPE AND SCREEN
ABO/RH(D): O POS
Antibody Screen: NEGATIVE
Unit division: 0
Unit division: 0

## 2023-11-04 LAB — BPAM RBC
Blood Product Expiration Date: 202503112359
Blood Product Expiration Date: 202503112359
ISSUE DATE / TIME: 202502052145
ISSUE DATE / TIME: 202502060829
Unit Type and Rh: 5100
Unit Type and Rh: 5100

## 2023-11-15 ENCOUNTER — Other Ambulatory Visit (HOSPITAL_COMMUNITY): Payer: Self-pay | Admitting: Internal Medicine

## 2023-11-15 ENCOUNTER — Other Ambulatory Visit: Payer: Self-pay

## 2023-11-15 DIAGNOSIS — N920 Excessive and frequent menstruation with regular cycle: Secondary | ICD-10-CM

## 2023-12-12 ENCOUNTER — Ambulatory Visit (HOSPITAL_COMMUNITY)
Admission: RE | Admit: 2023-12-12 | Discharge: 2023-12-12 | Disposition: A | Discharge status: Home / Self Care | Payer: Commercial Managed Care - HMO | Source: Ambulatory Visit | Attending: Internal Medicine | Admitting: Internal Medicine

## 2023-12-12 DIAGNOSIS — N920 Excessive and frequent menstruation with regular cycle: Secondary | ICD-10-CM | POA: Insufficient documentation

## 2024-01-06 ENCOUNTER — Other Ambulatory Visit: Payer: Self-pay

## 2024-01-06 ENCOUNTER — Emergency Department (HOSPITAL_COMMUNITY)

## 2024-01-06 ENCOUNTER — Encounter (HOSPITAL_COMMUNITY): Payer: Self-pay

## 2024-01-06 ENCOUNTER — Emergency Department (HOSPITAL_COMMUNITY)
Admission: EM | Admit: 2024-01-06 | Discharge: 2024-01-06 | Attending: Emergency Medicine | Admitting: Emergency Medicine

## 2024-01-06 DIAGNOSIS — D649 Anemia, unspecified: Secondary | ICD-10-CM | POA: Insufficient documentation

## 2024-01-06 DIAGNOSIS — N939 Abnormal uterine and vaginal bleeding, unspecified: Secondary | ICD-10-CM | POA: Diagnosis present

## 2024-01-06 DIAGNOSIS — J020 Streptococcal pharyngitis: Secondary | ICD-10-CM | POA: Diagnosis not present

## 2024-01-06 DIAGNOSIS — N23 Unspecified renal colic: Secondary | ICD-10-CM | POA: Diagnosis not present

## 2024-01-06 DIAGNOSIS — N920 Excessive and frequent menstruation with regular cycle: Secondary | ICD-10-CM | POA: Insufficient documentation

## 2024-01-06 LAB — COMPREHENSIVE METABOLIC PANEL WITH GFR
ALT: 16 U/L (ref 0–44)
AST: 19 U/L (ref 15–41)
Albumin: 3.5 g/dL (ref 3.5–5.0)
Alkaline Phosphatase: 56 U/L (ref 38–126)
Anion gap: 7 (ref 5–15)
BUN: 10 mg/dL (ref 6–20)
CO2: 23 mmol/L (ref 22–32)
Calcium: 8.7 mg/dL — ABNORMAL LOW (ref 8.9–10.3)
Chloride: 108 mmol/L (ref 98–111)
Creatinine, Ser: 0.68 mg/dL (ref 0.44–1.00)
GFR, Estimated: >60 mL/min (ref >60)
Glucose, Bld: 79 mg/dL (ref 70–99)
Potassium: 4 mmol/L (ref 3.5–5.1)
Sodium: 138 mmol/L (ref 135–145)
Total Bilirubin: 0.2 mg/dL (ref 0.0–1.2)
Total Protein: 7.2 g/dL (ref 6.5–8.1)

## 2024-01-06 LAB — URINALYSIS, W/ REFLEX TO CULTURE (INFECTION SUSPECTED)
Bacteria, UA: NONE SEEN
Bilirubin Urine: NEGATIVE
Glucose, UA: NEGATIVE mg/dL
Ketones, ur: NEGATIVE mg/dL
Leukocytes,Ua: NEGATIVE
Nitrite: NEGATIVE
Protein, ur: NEGATIVE mg/dL
Specific Gravity, Urine: 1.011 (ref 1.005–1.030)
pH: 6 (ref 5.0–8.0)

## 2024-01-06 LAB — GROUP A STREP BY PCR: Group A Strep by PCR: DETECTED — AB

## 2024-01-06 LAB — IRON AND TIBC
Iron: 16 ug/dL — ABNORMAL LOW (ref 28–170)
Saturation Ratios: 3 % — ABNORMAL LOW (ref 10.4–31.8)
TIBC: 468 ug/dL — ABNORMAL HIGH (ref 250–450)
UIBC: 452 ug/dL

## 2024-01-06 LAB — RESP PANEL BY RT-PCR (RSV, FLU A&B, COVID)  RVPGX2
Influenza A by PCR: NEGATIVE
Influenza B by PCR: NEGATIVE
Resp Syncytial Virus by PCR: NEGATIVE
SARS Coronavirus 2 by RT PCR: NEGATIVE

## 2024-01-06 LAB — CBC
HCT: 26.8 % — ABNORMAL LOW (ref 36.0–46.0)
Hemoglobin: 7.3 g/dL — ABNORMAL LOW (ref 12.0–15.0)
MCH: 19.2 pg — ABNORMAL LOW (ref 26.0–34.0)
MCHC: 27.2 g/dL — ABNORMAL LOW (ref 30.0–36.0)
MCV: 70.3 fL — ABNORMAL LOW (ref 80.0–100.0)
Platelets: 250 10*3/uL (ref 150–400)
RBC: 3.81 MIL/uL — ABNORMAL LOW (ref 3.87–5.11)
RDW: 22.4 % — ABNORMAL HIGH (ref 11.5–15.5)
WBC: 5.3 10*3/uL (ref 4.0–10.5)
nRBC: 0 % (ref 0.0–0.2)

## 2024-01-06 LAB — FERRITIN: Ferritin: 2 ng/mL — ABNORMAL LOW (ref 11–307)

## 2024-01-06 LAB — HCG, SERUM, QUALITATIVE: Preg, Serum: NEGATIVE

## 2024-01-06 LAB — FOLATE: Folate: 31.7 ng/mL (ref >5.9)

## 2024-01-06 LAB — VITAMIN B12: Vitamin B-12: 122 pg/mL — ABNORMAL LOW (ref 180–914)

## 2024-01-06 LAB — PREPARE RBC (CROSSMATCH)

## 2024-01-06 MED ORDER — PENICILLIN G BENZATHINE 1200000 UNIT/2ML IM SUSY
1.2000 10*6.[IU] | PREFILLED_SYRINGE | Freq: Once | INTRAMUSCULAR | Status: AC
  Administered 2024-01-06: 1.2 10*6.[IU] via INTRAMUSCULAR
  Filled 2024-01-06: qty 2

## 2024-01-06 MED ORDER — TAMSULOSIN HCL 0.4 MG PO CAPS: 0.4000 mg | ORAL_CAPSULE | Freq: Every day | ORAL | 0 refills | Status: AC

## 2024-01-06 MED ORDER — SODIUM CHLORIDE 0.9% IV SOLUTION: Freq: Once | INTRAVENOUS | Status: DC

## 2024-01-06 MED ORDER — ONDANSETRON HCL 4 MG/2ML IJ SOLN
4.0000 mg | Freq: Once | INTRAMUSCULAR | Status: AC
  Administered 2024-01-06: 4 mg via INTRAVENOUS
  Filled 2024-01-06: qty 2

## 2024-01-06 MED ORDER — IOHEXOL 350 MG/ML SOLN
75.0000 mL | Freq: Once | INTRAVENOUS | Status: AC | PRN
  Administered 2024-01-06: 75 mL via INTRAVENOUS

## 2024-01-06 MED ORDER — OXYCODONE-ACETAMINOPHEN 5-325 MG PO TABS: 1.0000 | ORAL_TABLET | Freq: Four times a day (QID) | ORAL | 0 refills | Status: AC | PRN

## 2024-01-06 MED ORDER — MORPHINE SULFATE (PF) 4 MG/ML IV SOLN
4.0000 mg | Freq: Once | INTRAVENOUS | Status: AC
  Administered 2024-01-06: 4 mg via INTRAVENOUS
  Filled 2024-01-06: qty 1

## 2024-01-06 NOTE — ED Triage Notes (Signed)
 Pt from jail, states they did blood work yesterday and pt has low hbg. C/O weakness and dizziness for a week. C/O vaginal bleeding for a week. Denies bloody stools.

## 2024-01-06 NOTE — Discharge Instructions (Addendum)
 You have vaginal bleeding causing your anemia.  You were given 1 unit of blood  You also have strep throat and you are given antibiotics  Finally you have a kidney stone and you were given some pain medicine.  I recommend taking Percocet 1 tablet every 6 hours as needed for pain.  Also recommend taking Flomax 0.4 mg daily for a week  You need to follow-up with urology and GYN doctor  I also recommend that you have repeat CBC in a week  Return to ER if you have uncontrolled bleeding or sore throat or abdominal pain or vomiting

## 2024-01-06 NOTE — ED Provider Triage Note (Signed)
 Emergency Medicine Provider Triage Evaluation Note  Peggy Reid , a 43 y.o. female  was evaluated in triage.  Pt complains of low hemoglobin.  Patient reports had blood work done yesterday and her hemoglobin was 6.  Has associated lightheadedness and dizziness.  Denies any vaginal bleeding but does state that she notices blood in her urine.  She is also having right-sided abdominal pain that radiates to the flank.  Denies history of pain like this in the past.  Endorses some diarrhea but denies nausea or vomiting.  Endorses sore throat as well and subjective fevers.  Review of Systems  Positive: Low hemoglobin, hematuria, right-sided abdominal pain Negative: Chest pain, shortness of breath  Physical Exam  BP (!) 131/94 (BP Location: Left Arm)   Pulse 61   Temp 98.3 F (36.8 C) (Oral)   Resp 18   Ht 5\' 6"  (1.676 m)   Wt 88.5 kg   LMP 12/30/2023   SpO2 100%   BMI 31.47 kg/m  Gen:   Awake, no distress  Resp:  Normal effort  MSK:   Moves extremities without difficulty  Other:    Medical Decision Making  Medically screening exam initiated at 12:39 PM.  Appropriate orders placed.  Peggy Reid Peggy Reid was informed that the remainder of the evaluation will be completed by another provider, this initial triage assessment does not replace that evaluation, and the importance of remaining in the ED until their evaluation is complete.   Maxwell Marion, PA-C 01/06/24 1240

## 2024-01-06 NOTE — ED Provider Notes (Signed)
  EMERGENCY DEPARTMENT AT [facility] Provider Note   CSN: [id number] Arrival date & time: 01/06/24  1215     History  Chief Complaint  Patient presents with   Abnormal Lab    Peggy Reid is a 43 y.o. female history of heavy menses here presenting with vaginal bleeding and right flank pain and sore throat.  Patient states that she has been having heavy menses for the last 3 weeks.  She states that it stopped 2 weeks ago.  She states that she had to changed more than 5 pads every day for that week.  She states that for the last several days she has been having right-sided flank pain.  She also has diffuse cramps and also feels weak and tired.  Finally patient also has some sore throat.  Patient currently is in jail.  Patient had CBC checked yesterday in jail and hemoglobin was 6.5.  Patient had previous transfusion for symptomatic anemia from vaginal bleeding.  The history is provided by the patient.       Home Medications Prior to Admission medications   Not on File      Allergies    Patient has no known allergies.    Review of Systems   Review of Systems  HENT:  Positive for sore throat.   Gastrointestinal:  Positive for abdominal pain.  All other systems reviewed and are negative.   Physical Exam Updated Vital Signs BP (!) 131/94 (BP Location: Left Arm)   Pulse 61   Temp 98.3 F (36.8 C) (Oral)   Resp 18   Ht 5\' 6"  (1.676 m)   Wt 88.5 kg   LMP 12/30/2023   SpO2 100%   BMI 31.47 kg/m  Physical Exam Vitals and nursing note reviewed.  HENT:     Head: Normocephalic.     Nose: Nose normal.     Mouth/Throat:     Mouth: Mucous membranes are moist.     Comments: Posterior pharynx is erythematous.  No tonsillar exudates Eyes:     Extraocular Movements: Extraocular movements intact.     Pupils: Pupils are equal, round, and reactive to light.  Cardiovascular:     Rate and Rhythm: Normal rate and regular rhythm.     Pulses: Normal  pulses.     Heart sounds: Normal heart sounds.  Pulmonary:     Effort: Pulmonary effort is normal.     Breath sounds: Normal breath sounds.  Abdominal:     General: Abdomen is flat.     Palpations: Abdomen is soft.     Comments: Mild diffuse lower abdominal tenderness.  No rebound or guarding.  No obvious CVA tenderness  Musculoskeletal:        General: Normal range of motion.     Cervical back: Normal range of motion and neck supple.  Skin:    General: Skin is warm.     Capillary Refill: Capillary refill takes less than 2 seconds.  Neurological:     General: No focal deficit present.     Mental Status: She is alert.  Psychiatric:        Mood and Affect: Mood normal.     ED Results / Procedures / Treatments   Labs (all labs ordered are listed, but only abnormal results are displayed) Labs Reviewed  GROUP A STREP BY PCR - Abnormal; Notable for the following components:      Result Value   Group A Strep by PCR DETECTED (*)  All other components within normal limits  COMPREHENSIVE METABOLIC PANEL WITH GFR - Abnormal; Notable for the following components:   Calcium 8.7 (*)    All other components within normal limits  CBC - Abnormal; Notable for the following components:   RBC 3.81 (*)    Hemoglobin 7.3 (*)    HCT 26.8 (*)    MCV 70.3 (*)    MCH 19.2 (*)    MCHC 27.2 (*)    RDW 22.4 (*)    All other components within normal limits  VITAMIN B12 - Abnormal; Notable for the following components:   Vitamin B-12 122 (*)    All other components within normal limits  IRON AND TIBC - Abnormal; Notable for the following components:   Iron 16 (*)    TIBC 468 (*)    Saturation Ratios 3 (*)    All other components within normal limits  FERRITIN - Abnormal; Notable for the following components:   Ferritin 2 (*)    All other components within normal limits  RESP PANEL BY RT-PCR (RSV, FLU A&B, COVID)  RVPGX2  HCG, SERUM, QUALITATIVE  FOLATE  RETICULOCYTES  URINALYSIS, W/  REFLEX TO CULTURE (INFECTION SUSPECTED)  TYPE AND SCREEN    EKG None  Radiology CT ABDOMEN PELVIS W CONTRAST Result Date: 01/06/2024 CLINICAL DATA:  Right lower quadrant abdominal pain EXAM: CT ABDOMEN AND PELVIS WITH CONTRAST TECHNIQUE: Multidetector CT imaging of the abdomen and pelvis was performed using the standard protocol following bolus administration of intravenous contrast. RADIATION DOSE REDUCTION: This exam was performed according to the departmental dose-optimization program which includes automated exposure control, adjustment of the mA and/or kV according to patient size and/or use of iterative reconstruction technique. CONTRAST:  75mL OMNIPAQUE IOHEXOL 350 MG/ML SOLN COMPARISON:  September 18, 2023, December 12, 2023 FINDINGS: Lower chest: No focal airspace consolidation or pleural effusion. Hepatobiliary: No mass.No radiopaque stones or wall thickening of the gallbladder.No intrahepatic or extrahepatic biliary ductal dilation.The portal veins are patent. Pancreas: No mass or main ductal dilation.No peripancreatic inflammation or fluid collection. Spleen: Normal size. No mass. Adrenals/Urinary Tract: No adrenal masses. No renal mass. Punctate nonobstructive calyceal calculus in the upper pole of the left kidney. 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 There is a calcification in the region of the left UPJ (axial 43), measuring 2.5 x 2 x 2 mm. Decompressed urinary bladder without visualized focal abnormality. Stomach/Bowel: The stomach contains ingested material without focal abnormality. No small bowel wall thickening or inflammation. No small bowel obstruction. Normal appendix. Vascular/Lymphatic: No aortic aneurysm. No intraabdominal or pelvic lymphadenopathy. Reproductive: Enlarged uterus containing multiple fibroids, better visualized on the comparison pelvic ultrasound. No concerning adnexal mass.No free pelvic fluid. Other: No pneumoperitoneum,  ascites, or mesenteric inflammation. Musculoskeletal: No acute fracture or destructive lesion.Degenerative disc disease at L5-S1. Multilevel thoracic osteophytosis. IMPRESSION: 1. 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 Mild left-sided pelviectasis, without hydronephrosis. 2. Punctate nonobstructive calculus in the upper pole of the left kidney. 3. Enlarged uterus containing multiple fibroids, better visualized on the comparison ultrasound. 4. Normal appendix. Electronically Signed   By: Wallie Char M.D.   On: 01/06/2024 15:24    Procedures Procedures    Medications Ordered in ED Medications  iohexol (OMNIPAQUE) 350 MG/ML injection 75 mL (75 mLs Intravenous Contrast Given 01/06/24 1424)  ED Course/ Medical Decision Making/ A&P                                 Medical Decision Making Rufus Cypert Wombles is a 43 y.o. female here presenting with sore throat and vaginal bleeding and abdominal pain.  Concern for possible strep pharyngitis versus symptomatic anemia versus kidney stone.  Plan to get CBC and CMP and UA and CT abdomen pelvis and strep test.   4:15 PM Patient's hemoglobin today is 7.3.  Her group A strep is positive.  Patient also has small left ureteral calculus.  I have ordered Bicillin and IV fluids and pain medicine.  I have also ordered 1 unit PRBC transfusion given that she is symptomatic   8:02 PM Patient received 1 unit PRBC.  Patient's urinalysis did not show any UTI.  Patient has no reactions to the blood.  Will discharge back to jail with a course of pain medicine and Flomax and urology and GYN follow-up  Problems Addressed: Anemia, unspecified type: acute illness or injury Menorrhagia with regular cycle: acute illness or injury Renal colic: acute illness or injury  Amount and/or Complexity of Data Reviewed Labs: ordered. Decision-making  details documented in ED Course.  Risk Prescription drug management.    Final Clinical Impression(s) / ED Diagnoses Final diagnoses:  None    Rx / DC Orders ED Discharge Orders     None         Charlynne Pander, MD 01/06/24 2003

## 2024-01-07 LAB — TYPE AND SCREEN
ABO/RH(D): O POS
Antibody Screen: NEGATIVE
Unit division: 0

## 2024-01-07 LAB — BPAM RBC
Blood Product Expiration Date: 202505132359
ISSUE DATE / TIME: 202504111707
Unit Type and Rh: 5100

## 2024-02-07 ENCOUNTER — Telehealth: Payer: Self-pay | Admitting: Internal Medicine

## 2024-02-21 ENCOUNTER — Other Ambulatory Visit

## 2024-02-21 ENCOUNTER — Encounter: Admitting: Internal Medicine
